# Patient Record
Sex: Male | Born: 1950 | Race: White | Hispanic: No | Marital: Married | Smoking: Current every day smoker
Health system: Southern US, Community
[De-identification: ages and names within clinical notes are randomized; demographics above are authoritative.]

## PROBLEM LIST (undated history)

## (undated) DIAGNOSIS — R0602 Shortness of breath: Secondary | ICD-10-CM

## (undated) DIAGNOSIS — F10229 Alcohol dependence with intoxication, unspecified: Secondary | ICD-10-CM

## (undated) DIAGNOSIS — Z8679 Personal history of other diseases of the circulatory system: Secondary | ICD-10-CM

## (undated) DIAGNOSIS — J42 Unspecified chronic bronchitis: Secondary | ICD-10-CM

## (undated) DIAGNOSIS — G43909 Migraine, unspecified, not intractable, without status migrainosus: Secondary | ICD-10-CM

## (undated) DIAGNOSIS — Z122 Encounter for screening for malignant neoplasm of respiratory organs: Secondary | ICD-10-CM

## (undated) DIAGNOSIS — I4891 Unspecified atrial fibrillation: Secondary | ICD-10-CM

## (undated) DIAGNOSIS — I10 Essential (primary) hypertension: Secondary | ICD-10-CM

## (undated) DIAGNOSIS — R519 Headache, unspecified: Secondary | ICD-10-CM

## (undated) DIAGNOSIS — F10982 Alcohol use, unspecified with alcohol-induced sleep disorder: Secondary | ICD-10-CM

## (undated) DIAGNOSIS — I480 Paroxysmal atrial fibrillation: Secondary | ICD-10-CM

## (undated) DIAGNOSIS — E119 Type 2 diabetes mellitus without complications: Secondary | ICD-10-CM

## (undated) DIAGNOSIS — N529 Male erectile dysfunction, unspecified: Secondary | ICD-10-CM

## (undated) DIAGNOSIS — K219 Gastro-esophageal reflux disease without esophagitis: Secondary | ICD-10-CM

## (undated) DIAGNOSIS — E782 Mixed hyperlipidemia: Secondary | ICD-10-CM

## (undated) DIAGNOSIS — F17200 Nicotine dependence, unspecified, uncomplicated: Secondary | ICD-10-CM

## (undated) DIAGNOSIS — R42 Dizziness and giddiness: Secondary | ICD-10-CM

## (undated) DIAGNOSIS — E1142 Type 2 diabetes mellitus with diabetic polyneuropathy: Secondary | ICD-10-CM

## (undated) DIAGNOSIS — J449 Chronic obstructive pulmonary disease, unspecified: Secondary | ICD-10-CM

## (undated) HISTORY — PX: COLONOSCOPY: SHX174

## (undated) HISTORY — PX: FLEXIBLE BRONCHOSCOPY W/ UPPER ENDOSCOPY: SHX1648

---

## 2018-08-09 ENCOUNTER — Other Ambulatory Visit: Payer: Self-pay | Admitting: Physician Assistant

## 2018-08-09 DIAGNOSIS — R1310 Dysphagia, unspecified: Secondary | ICD-10-CM

## 2018-08-12 ENCOUNTER — Encounter (INDEPENDENT_AMBULATORY_CARE_PROVIDER_SITE_OTHER): Payer: Self-pay | Admitting: Vascular Surgery

## 2018-08-12 ENCOUNTER — Other Ambulatory Visit (INDEPENDENT_AMBULATORY_CARE_PROVIDER_SITE_OTHER): Payer: Self-pay | Admitting: Vascular Surgery

## 2018-08-12 ENCOUNTER — Ambulatory Visit (INDEPENDENT_AMBULATORY_CARE_PROVIDER_SITE_OTHER): Payer: Medicare Other

## 2018-08-12 ENCOUNTER — Ambulatory Visit (INDEPENDENT_AMBULATORY_CARE_PROVIDER_SITE_OTHER): Payer: Medicare Other | Admitting: Vascular Surgery

## 2018-08-12 DIAGNOSIS — L97919 Non-pressure chronic ulcer of unspecified part of right lower leg with unspecified severity: Secondary | ICD-10-CM

## 2018-08-12 DIAGNOSIS — E782 Mixed hyperlipidemia: Secondary | ICD-10-CM | POA: Diagnosis not present

## 2018-08-12 DIAGNOSIS — J439 Emphysema, unspecified: Secondary | ICD-10-CM

## 2018-08-12 DIAGNOSIS — K219 Gastro-esophageal reflux disease without esophagitis: Secondary | ICD-10-CM | POA: Diagnosis not present

## 2018-08-13 ENCOUNTER — Ambulatory Visit
Admission: RE | Admit: 2018-08-13 | Discharge: 2018-08-13 | Disposition: A | Discharge status: Home / Self Care | Payer: Medicare Other | Source: Ambulatory Visit | Attending: Physician Assistant | Admitting: Physician Assistant

## 2018-08-13 DIAGNOSIS — R1310 Dysphagia, unspecified: Secondary | ICD-10-CM

## 2018-08-19 ENCOUNTER — Encounter (INDEPENDENT_AMBULATORY_CARE_PROVIDER_SITE_OTHER): Payer: Self-pay | Admitting: Vascular Surgery

## 2018-08-19 DIAGNOSIS — K219 Gastro-esophageal reflux disease without esophagitis: Secondary | ICD-10-CM | POA: Insufficient documentation

## 2018-08-19 DIAGNOSIS — E785 Hyperlipidemia, unspecified: Secondary | ICD-10-CM | POA: Insufficient documentation

## 2018-08-19 DIAGNOSIS — J449 Chronic obstructive pulmonary disease, unspecified: Secondary | ICD-10-CM | POA: Insufficient documentation

## 2018-08-19 DIAGNOSIS — L97909 Non-pressure chronic ulcer of unspecified part of unspecified lower leg with unspecified severity: Secondary | ICD-10-CM | POA: Insufficient documentation

## 2018-08-19 NOTE — Progress Notes (Signed)
MRN : [id number]  Richard Adams is a 68 y.o. (09-Nov-1950) male who presents with chief complaint of  Chief Complaint  Patient presents with  . Establish Care  .  History of Present Illness:   The patient is seen for evaluation of painful lower extremities and diminished pulses associated with ulceration of the foot.  The patient notes the ulcer has been present for multiple weeks and has not been improving.  It is very painful and has had some drainage.  No specific history of trauma noted by the patient.  The patient denies fever or chills.  the patient does have diabetes which has been difficult to control.  Patient notes prior to the ulcer developing his extremities were not painful with ambulation or activity  The patient denies rest pain or dangling of an extremity off the side of the bed during the night for relief. No prior interventions or surgeries.  No history of back problems or DJD of the lumbar sacral spine.   The patient denies amaurosis fugax or recent TIA symptoms. There are no recent neurological changes noted. The patient denies history of DVT, PE or superficial thrombophlebitis. The patient denies recent episodes of angina or shortness of breath.   ABI's are normal bilaterally  Current Meds  Medication Sig  . albuterol (PROVENTIL HFA;VENTOLIN HFA) 108 (90 Base) MCG/ACT inhaler Inhale into the lungs.  Marland Kitchen apixaban (ELIQUIS) 5 MG TABS tablet Take by mouth.  . dronedarone (MULTAQ) 400 MG tablet Take by mouth 2 (two) times daily.  Marland Kitchen EPINEPHrine 0.3 mg/0.3 mL IJ SOAJ injection as needed.  . famotidine (PEPCID) 20 MG tablet Take by mouth as needed.  . folic acid (FOLVITE) 1 MG tablet Take by mouth daily.  . Magnesium Amino Acid Chelate 20 % POWD daily.  . metFORMIN (GLUCOPHAGE) 500 MG tablet Take by mouth 2 (two) times daily.  . metoprolol succinate (TOPROL-XL) 25 MG 24 hr tablet Take 1 tablet by mouth daily.  . nicotine (NICOTROL) 10 MG inhaler Begin 4 weeks before  your Quit Smoking Day (10 cartridges per day)  Inhale multiple small puffs into mouth to control urges  . rosuvastatin (CRESTOR) 5 MG tablet 1 po three times weekly  . thiamine 100 MG tablet Take by mouth daily.  . traZODone (DESYREL) 100 MG tablet Take by mouth as needed.  . triamcinolone ointment (KENALOG) 0.1 % as needed.    No past medical history on file.    Social History Social History   Tobacco Use  . Smoking status: Current Every Day Smoker    Packs/day: 1.00    Types: Cigarettes  . Smokeless tobacco: Never Used  Substance Use Topics  . Alcohol use: Yes    Comment: 6-8 drink a day  . Drug use: Never    Family History No family history on file. No family history of bleeding/clotting disorders, porphyria or autoimmune disease   Allergies  Allergen Reactions  . Wasp Venom Protein Anaphylaxis     REVIEW OF SYSTEMS (Negative unless checked)  Constitutional: [] Weight loss  [] Fever  [] Chills Cardiac: [] Chest pain   [] Chest pressure   [] Palpitations   [] Shortness of breath when laying flat   [] Shortness of breath with exertion. Vascular:  [] Pain in legs with walking   [] Pain in legs at rest  [] History of DVT   [] Phlebitis   [x] Swelling in legs   [] Varicose veins   [x] Non-healing ulcers Pulmonary:   [] Uses home oxygen   [] Productive cough   [] Hemoptysis   []   Wheeze  [] COPD   [] Asthma Neurologic:  [] Dizziness   [] Seizures   [] History of stroke   [] History of TIA  [] Aphasia   [] Vissual changes   [] Weakness or numbness in arm   [] Weakness or numbness in leg Musculoskeletal:   [] Joint swelling   [] Joint pain   [] Low back pain Hematologic:  [] Easy bruising  [] Easy bleeding   [] Hypercoagulable state   [] Anemic Gastrointestinal:  [] Diarrhea   [] Vomiting  [] Gastroesophageal reflux/heartburn   [] Difficulty swallowing. Genitourinary:  [] Chronic kidney disease   [] Difficult urination  [] Frequent urination   [] Blood in urine Skin:  [] Rashes   [] Ulcers  Psychological:  [] History of  anxiety   []  History of major depression.  Physical Examination  Vitals:   08/12/18 1551  BP: 120/79  Pulse: 63  Resp: 16  Weight: 196 lb 6.4 oz (89.1 kg)  Height: 6\' 1"  (1.854 m)   Body mass index is 25.91 kg/m. Gen: WD/WN, NAD Head: [place]/AT, No temporalis wasting.  Ear/Nose/Throat: Hearing grossly intact, nares w/o erythema or drainage, poor dentition Eyes: PER, EOMI, sclera nonicteric.  Neck: Supple, no masses.  No bruit or JVD.  Pulmonary:  Good air movement, clear to auscultation bilaterally, no use of accessory muscles.  Cardiac: RRR, normal S1, S2, no Murmurs. Vascular: ulcers right foot noninfected Vessel Right Left  Radial Palpable Palpable  PT Palpable Palpable  DP Palpable Palpable  Gastrointestinal: soft, non-distended. No guarding/no peritoneal signs.  Musculoskeletal: M/S 5/5 throughout.  No deformity or atrophy.  Neurologic: CN 2-12 intact. Pain and light touch intact in extremities.  Symmetrical.  Speech is fluent. Motor exam as listed above. Psychiatric: Judgment intact, Mood & affect appropriate for pt's clinical situation. Dermatologic: No rashes + ulcers noted.  No changes consistent with cellulitis. Lymph : No Cervical lymphadenopathy, no lichenification or skin changes of chronic lymphedema.  CBC No results found for: WBC, HGB, HCT, MCV, PLT  BMET No results found for: NA, K, CL, CO2, GLUCOSE, BUN, CREATININE, CALCIUM, GFRNONAA, GFRAA CrCl cannot be calculated (No successful lab value found.).  COAG No results found for: INR, PROTIME  Radiology Dg Esophagus  Result Date: 08/13/2018 CLINICAL DATA:  Dysphagia EXAM: ESOPHOGRAM / BARIUM SWALLOW / BARIUM TABLET STUDY TECHNIQUE: Combined double contrast and single contrast examination performed using effervescent crystals, thick barium liquid, and thin barium liquid. The patient was observed with fluoroscopy swallowing a 13 mm barium sulphate tablet. FLUOROSCOPY TIME:  Fluoroscopy Time:  36 seconds  Radiation Exposure Index (if provided by the fluoroscopic device): 4 mGy Number of Acquired Spot Images: 0 COMPARISON:  None. FINDINGS: There was normal pharyngeal anatomy and motility. Contrast flowed freely through the esophagus without evidence of stricture or mass. There was normal esophageal mucosa without evidence of irregularity or ulceration. Esophageal motility was normal. Moderate gastroesophageal reflux. No definite hiatal hernia was demonstrated. At the end of the examination a 13 mm barium tablet was administered which transited through the esophagus and esophagogastric junction without delay. IMPRESSION: 1. Moderate severity gastroesophageal reflux. 2. Otherwise normal barium swallow. Electronically Signed   By: Kathreen Devoid   On: 08/13/2018 09:46   Vas [place] Abi With/wo Tbi  Result Date: 08/16/2018 LOWER EXTREMITY DOPPLER STUDY Indications: Rest pain.  Performing Technologist: Charlane Ferretti RT (R)(VS)  Examination Guidelines: A complete evaluation includes at minimum, Doppler waveform signals and systolic blood pressure reading at the level of bilateral brachial, anterior tibial, and posterior tibial arteries, when vessel segments are accessible. Bilateral testing is considered an integral part of a  complete examination. Photoelectric Plethysmograph (PPG) waveforms and toe systolic pressure readings are included as required and additional duplex testing as needed. Limited examinations for reoccurring indications may be performed as noted.  ABI Findings: +---------+------------------+-----+---------+--------+ Right    Rt Pressure (mmHg)IndexWaveform Comment  +---------+------------------+-----+---------+--------+ Brachial 122                                      +---------+------------------+-----+---------+--------+ ATA      161               1.32 triphasic         +---------+------------------+-----+---------+--------+ PTA      158               1.30 triphasic          +---------+------------------+-----+---------+--------+ Great Toe142               1.16 Normal            +---------+------------------+-----+---------+--------+ +---------+------------------+-----+---------+-------+ Left     Lt Pressure (mmHg)IndexWaveform Comment +---------+------------------+-----+---------+-------+ Brachial 113                                     +---------+------------------+-----+---------+-------+ ATA      149               1.22 triphasic        +---------+------------------+-----+---------+-------+ PTA      169               1.39 triphasic        +---------+------------------+-----+---------+-------+ Georgeanna Harrison               1.13 Normal           +---------+------------------+-----+---------+-------+ +-------+-----------+-----------+------------+------------+ ABI/TBIToday's ABIToday's TBIPrevious ABIPrevious TBI +-------+-----------+-----------+------------+------------+ Right  1.32       1.16                                +-------+-----------+-----------+------------+------------+ Left   1.39       1.13                                +-------+-----------+-----------+------------+------------+ Bilateral ABI pressures are elevated suggestive of medial wall calcification. Summary: Right: Resting right ankle-brachial index indicates noncompressible right lower extremity arteries.The right toe-brachial index is normal. ABIs are unreliable. Left: Resting left ankle-brachial index indicates noncompressible left lower extremity arteries.The left toe-brachial index is normal. ABIs are unreliable.  *See table(s) above for measurements and observations.  Electronically signed by Hortencia Pilar MD on 08/16/2018 at 4:42:53 PM.   Final      Assessment/Plan 1. Ulcer of right lower extremity, unspecified ulcer stage ([place])  Recommend:  Continue wound care as ordered by wound service  I do not find evidence of life style limiting vascular  disease. The patient specifically denies life style limitation.  Previous noninvasive studies including ABI's of the legs do not identify critical vascular problems.  The patient should continue walking and begin a more formal exercise program. The patient should continue his antiplatelet therapy and aggressive treatment of the lipid abnormalities.  The patient should begin wearing graduated compression socks 15-20 mmHg strength to control her mild edema.  Patient will follow-up with me on a PRN  basis   2. Mixed hyperlipidemia Continue statin as ordered and reviewed, no changes at this time   3. Pulmonary emphysema, unspecified emphysema type ([place]) Continue pulmonary medications and aerosols as already ordered, these medications have been reviewed and there are no changes at this time.    4. Gastroesophageal reflux disease without esophagitis Continue PPI as already ordered, this medication has been reviewed and there are no changes at this time.  Avoidence of caffeine and alcohol  Moderate elevation of the head of the bed     Hortencia Pilar, MD  08/19/2018 6:21 PM

## 2019-02-08 IMAGING — RF DG ESOPHAGUS
11 of 16 series · 14 of 24 positions shown · non-contrast
Comparison: None.

CLINICAL DATA: Dysphagia

EXAM:
ESOPHOGRAM / BARIUM SWALLOW / BARIUM TABLET STUDY
TECHNIQUE: Combined double contrast and single contrast examination performed
using effervescent crystals, thick barium liquid, and thin barium
liquid. The patient was observed with fluoroscopy swallowing a 13 mm
barium sulphate tablet.
FLUOROSCOPY TIME:  Fluoroscopy Time:  36 seconds
Radiation Exposure Index (if provided by the fluoroscopic device): 4
mGy
Number of Acquired Spot Images: 0

[Series 1: cp_standard · 0.25mm/px · 2 of 27 frames shown (1 of 11)]
[frame 3/27]
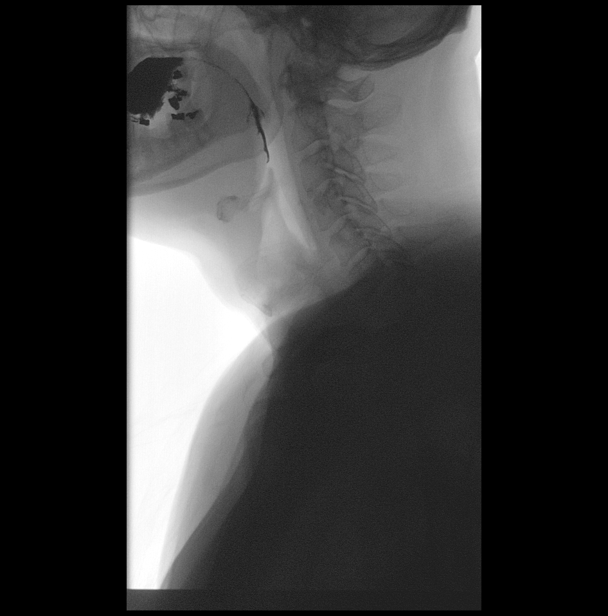
[frame 14/27]
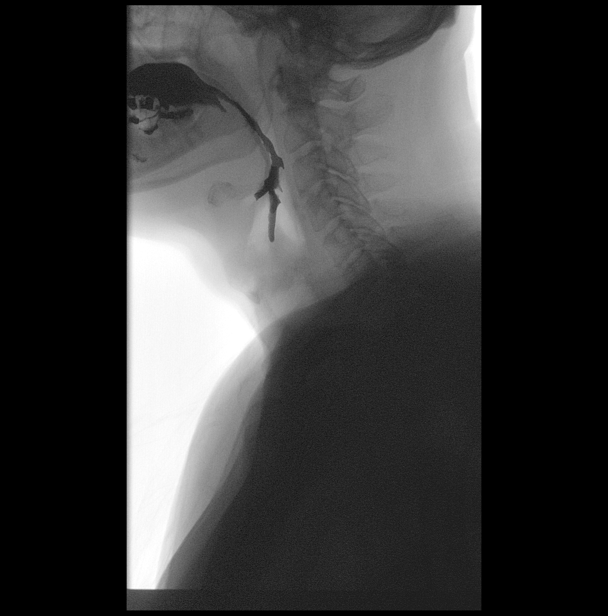

[Series 2: cp_standard · 0.25mm/px · 2 of 43 frames shown (2 of 11)]
[frame 7/43]
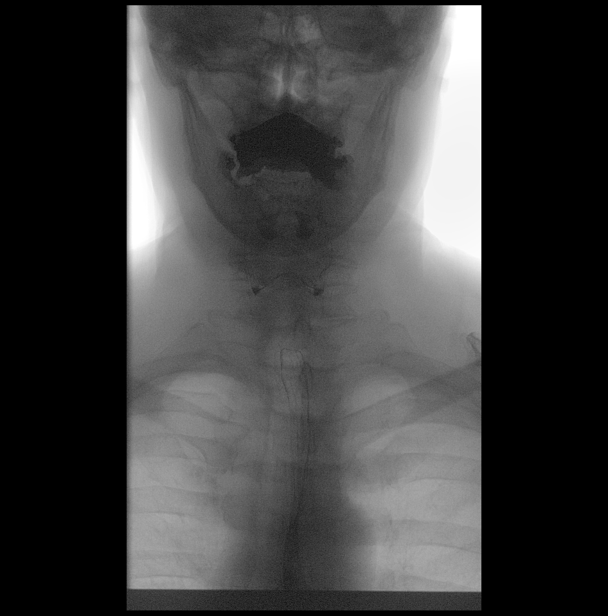
[frame 37/43]
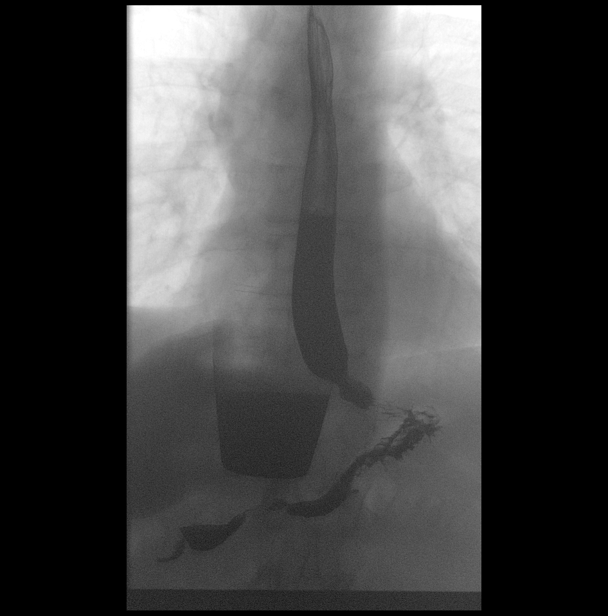

[Series 3: cp_standard · 0.25mm/px · 2 of 64 frames shown (3 of 11)]
[frame 4/64]
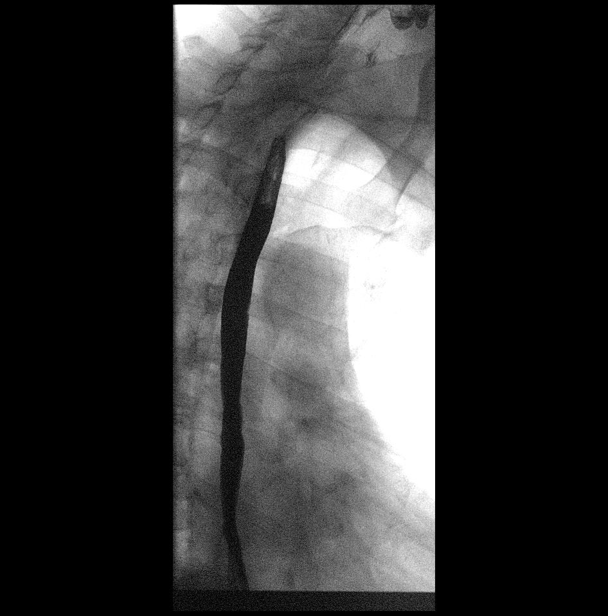
[frame 33/64]
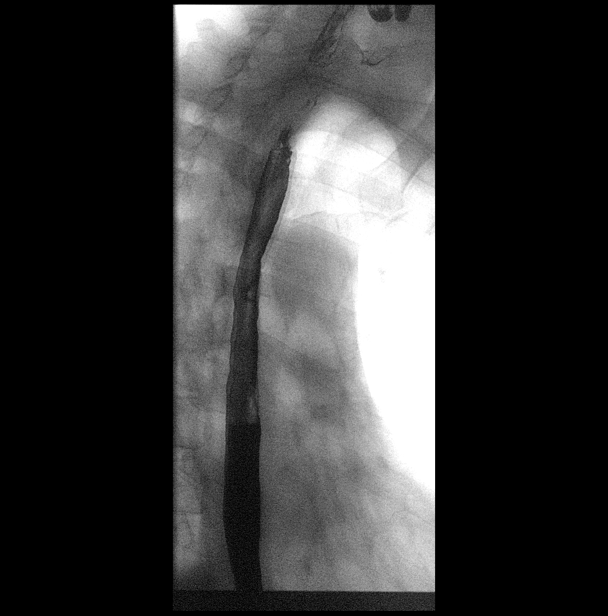

[Series 4: cp_standard · 0.25mm/px · 1 of 1 slices shown (4 of 11)]
[im 1/1]
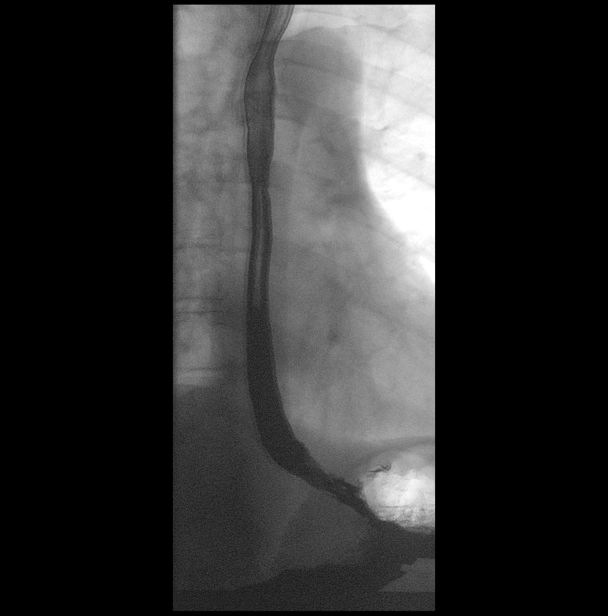

[Series 5: cp_standard · 0.25mm/px · 1 of 1 slices shown (5 of 11)]
[im 1/1]
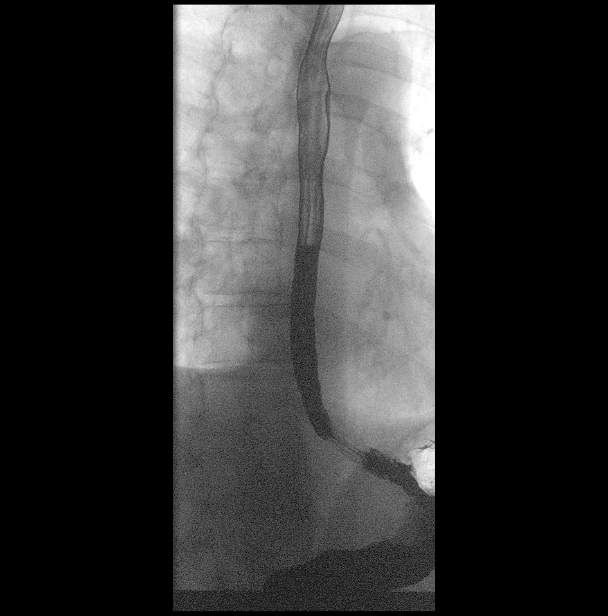

[Series 7: cp_standard · 0.25mm/px · 1 of 1 slices shown (6 of 11)]
[im 1/1]
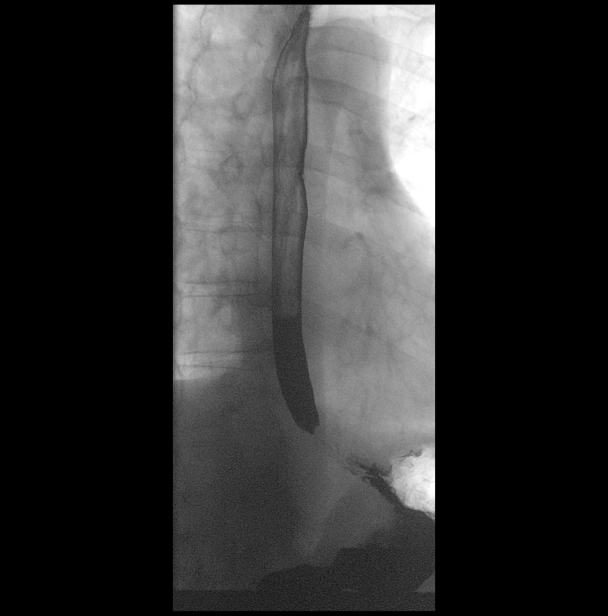

[Series 9: cp_standard · 0.25mm/px · 1 of 1 slices shown (7 of 11)]
[im 1/1]
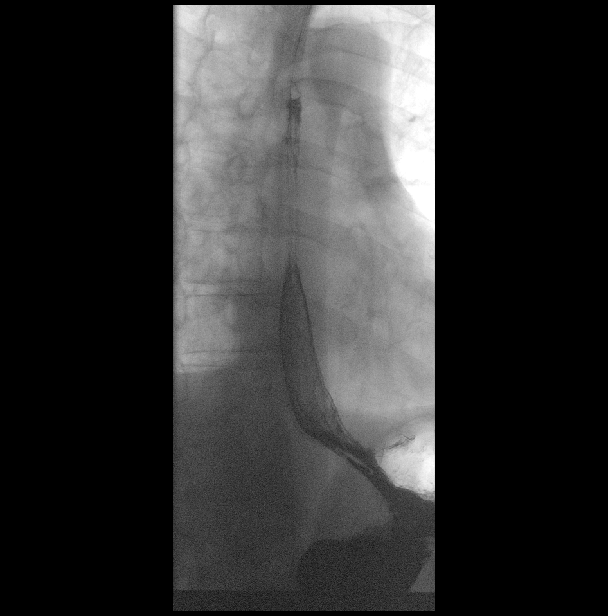

[Series 11: cp_standard · 0.28mm/px · 1 of 1 slices shown (8 of 11)]
[im 1/1]
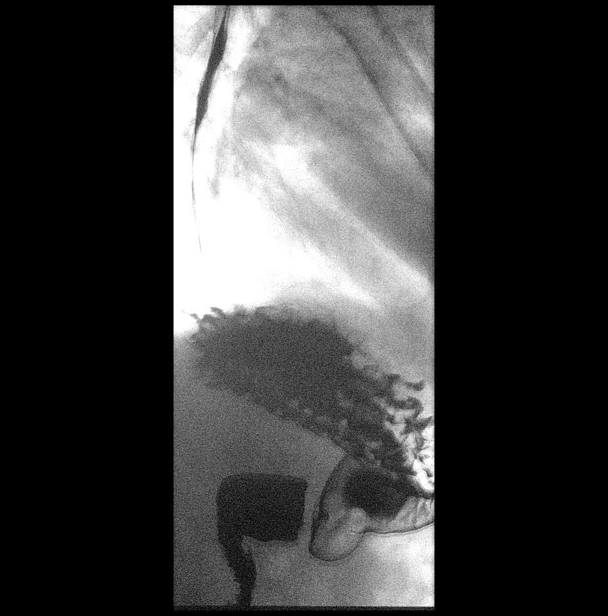

[Series 12: cp_standard · 0.27mm/px · 1 of 1 slices shown (9 of 11)]
[im 1/1]
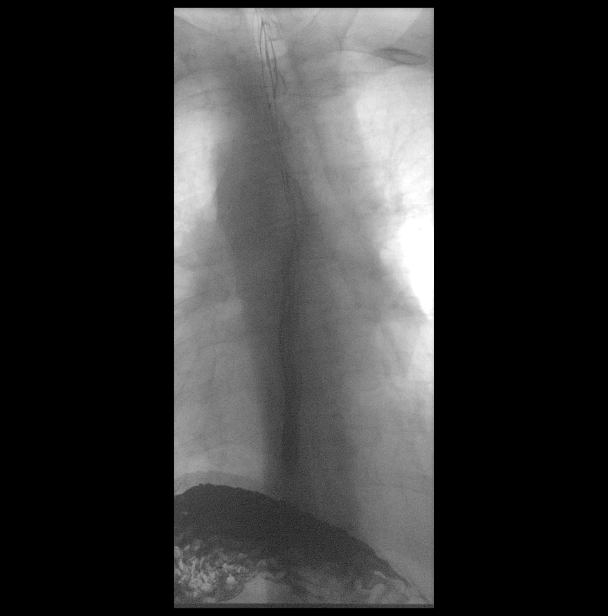

[Series 14: cp_standard · 0.28mm/px · 1 of 1 slices shown (10 of 11)]
[im 1/1]
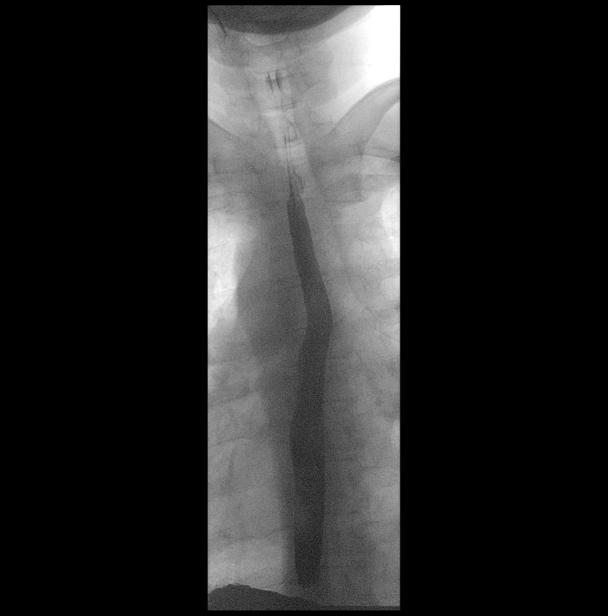

[Series 16: cp_standard · 0.28mm/px · 1 of 1 slices shown (11 of 11)]
[im 1/1]
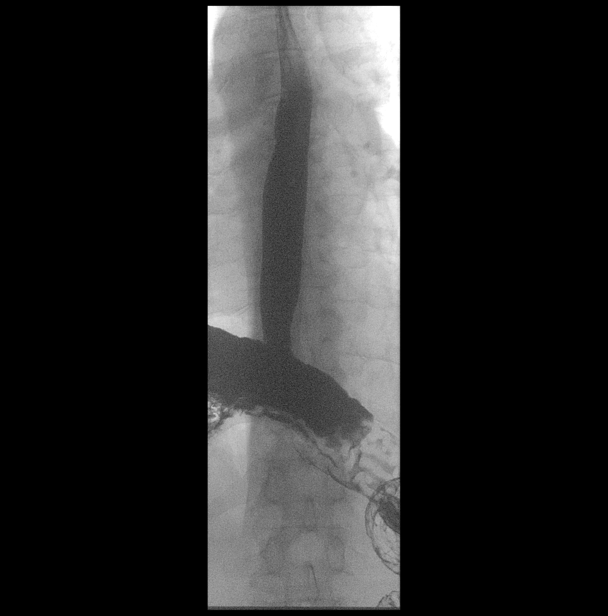

[14 of 24 positions shown; findings below may reference images not displayed]

FINDINGS: There was normal pharyngeal anatomy and motility. Contrast flowed
freely through the esophagus without evidence of stricture or mass.
There was normal esophageal mucosa without evidence of irregularity
or ulceration. Esophageal motility was normal. Moderate
gastroesophageal reflux. No definite hiatal hernia was demonstrated.

At the end of the examination a 13 mm barium tablet was administered
which transited through the esophagus and esophagogastric junction
without delay.
IMPRESSION: 1. Moderate severity gastroesophageal reflux.
2. Otherwise normal barium swallow.

## 2019-12-12 ENCOUNTER — Other Ambulatory Visit: Payer: Self-pay

## 2019-12-12 ENCOUNTER — Other Ambulatory Visit
Admission: RE | Admit: 2019-12-12 | Discharge: 2019-12-12 | Disposition: A | Discharge status: Home / Self Care | Payer: Medicare Other | Source: Ambulatory Visit | Attending: Internal Medicine | Admitting: Internal Medicine

## 2019-12-12 DIAGNOSIS — Z01812 Encounter for preprocedural laboratory examination: Secondary | ICD-10-CM | POA: Diagnosis present

## 2019-12-12 DIAGNOSIS — Z20822 Contact with and (suspected) exposure to covid-19: Secondary | ICD-10-CM | POA: Diagnosis not present

## 2019-12-13 ENCOUNTER — Encounter: Payer: Self-pay | Admitting: Internal Medicine

## 2019-12-13 LAB — SARS CORONAVIRUS 2 (TAT 6-24 HRS): SARS Coronavirus 2: NEGATIVE

## 2019-12-14 ENCOUNTER — Ambulatory Visit: Payer: Medicare Other | Admitting: Anesthesiology

## 2019-12-14 ENCOUNTER — Ambulatory Visit
Admission: RE | Admit: 2019-12-14 | Discharge: 2019-12-14 | Disposition: A | Discharge status: Home / Self Care | Payer: Medicare Other | Attending: Internal Medicine | Admitting: Internal Medicine

## 2019-12-14 ENCOUNTER — Other Ambulatory Visit: Payer: Self-pay

## 2019-12-14 ENCOUNTER — Encounter: Payer: Self-pay | Admitting: Internal Medicine

## 2019-12-14 ENCOUNTER — Encounter
Admission: RE | Disposition: A | Discharge status: Home / Self Care | Payer: Self-pay | Source: Home / Self Care | Attending: Internal Medicine

## 2019-12-14 DIAGNOSIS — R634 Abnormal weight loss: Secondary | ICD-10-CM | POA: Diagnosis not present

## 2019-12-14 DIAGNOSIS — Z7901 Long term (current) use of anticoagulants: Secondary | ICD-10-CM | POA: Diagnosis not present

## 2019-12-14 DIAGNOSIS — J449 Chronic obstructive pulmonary disease, unspecified: Secondary | ICD-10-CM | POA: Diagnosis not present

## 2019-12-14 DIAGNOSIS — R933 Abnormal findings on diagnostic imaging of other parts of digestive tract: Secondary | ICD-10-CM | POA: Diagnosis not present

## 2019-12-14 DIAGNOSIS — I480 Paroxysmal atrial fibrillation: Secondary | ICD-10-CM | POA: Insufficient documentation

## 2019-12-14 DIAGNOSIS — C154 Malignant neoplasm of middle third of esophagus: Secondary | ICD-10-CM | POA: Insufficient documentation

## 2019-12-14 DIAGNOSIS — Z79899 Other long term (current) drug therapy: Secondary | ICD-10-CM | POA: Insufficient documentation

## 2019-12-14 DIAGNOSIS — E1142 Type 2 diabetes mellitus with diabetic polyneuropathy: Secondary | ICD-10-CM | POA: Insufficient documentation

## 2019-12-14 DIAGNOSIS — R1314 Dysphagia, pharyngoesophageal phase: Secondary | ICD-10-CM | POA: Diagnosis not present

## 2019-12-14 DIAGNOSIS — E782 Mixed hyperlipidemia: Secondary | ICD-10-CM | POA: Insufficient documentation

## 2019-12-14 DIAGNOSIS — K219 Gastro-esophageal reflux disease without esophagitis: Secondary | ICD-10-CM | POA: Insufficient documentation

## 2019-12-14 HISTORY — DX: Gastro-esophageal reflux disease without esophagitis: K21.9

## 2019-12-14 HISTORY — DX: Unspecified atrial fibrillation: I48.91

## 2019-12-14 HISTORY — DX: Unspecified chronic bronchitis: J42

## 2019-12-14 HISTORY — DX: Personal history of other diseases of the circulatory system: Z86.79

## 2019-12-14 HISTORY — DX: Nicotine dependence, unspecified, uncomplicated: F17.200

## 2019-12-14 HISTORY — DX: Mixed hyperlipidemia: E78.2

## 2019-12-14 HISTORY — DX: Type 2 diabetes mellitus without complications: E11.9

## 2019-12-14 HISTORY — DX: Encounter for screening for malignant neoplasm of respiratory organs: Z12.2

## 2019-12-14 HISTORY — DX: Type 2 diabetes mellitus with diabetic polyneuropathy: E11.42

## 2019-12-14 HISTORY — PX: ESOPHAGOGASTRODUODENOSCOPY (EGD) WITH PROPOFOL: SHX5813

## 2019-12-14 HISTORY — DX: Alcohol use, unspecified with alcohol-induced sleep disorder: F10.982

## 2019-12-14 HISTORY — DX: Migraine, unspecified, not intractable, without status migrainosus: G43.909

## 2019-12-14 HISTORY — DX: Headache, unspecified: R51.9

## 2019-12-14 HISTORY — DX: Male erectile dysfunction, unspecified: N52.9

## 2019-12-14 HISTORY — DX: Chronic obstructive pulmonary disease, unspecified: J44.9

## 2019-12-14 HISTORY — DX: Dizziness and giddiness: R42

## 2019-12-14 HISTORY — DX: Essential (primary) hypertension: I10

## 2019-12-14 HISTORY — DX: Shortness of breath: R06.02

## 2019-12-14 HISTORY — DX: Paroxysmal atrial fibrillation: I48.0

## 2019-12-14 HISTORY — DX: Alcohol dependence with intoxication, unspecified: F10.229

## 2019-12-14 LAB — GLUCOSE, CAPILLARY: Glucose-Capillary: 121 mg/dL — ABNORMAL HIGH (ref 70–99)

## 2019-12-14 SURGERY — ESOPHAGOGASTRODUODENOSCOPY (EGD) WITH PROPOFOL
Anesthesia: General

## 2019-12-14 MED ORDER — SODIUM CHLORIDE 0.9 % IV SOLN: INTRAVENOUS | Status: DC

## 2019-12-14 MED ORDER — PROPOFOL 10 MG/ML IV BOLUS
INTRAVENOUS | Status: DC | PRN
  Administered 2019-12-14: 60 mg via INTRAVENOUS

## 2019-12-14 MED ORDER — PROPOFOL 500 MG/50ML IV EMUL
INTRAVENOUS | Status: AC
  Filled 2019-12-14: qty 50

## 2019-12-14 MED ORDER — PROPOFOL 500 MG/50ML IV EMUL
INTRAVENOUS | Status: DC | PRN
  Administered 2019-12-14: 150 ug/kg/min via INTRAVENOUS

## 2019-12-14 NOTE — Interval H&P Note (Signed)
History and Physical Interval Note:  12/14/2019 9:16 AM  Richard Adams  has presented today for surgery, with the diagnosis of DYSPHAGIA.  The various methods of treatment have been discussed with the patient and family. After consideration of risks, benefits and other options for treatment, the patient has consented to  Procedure(s): ESOPHAGOGASTRODUODENOSCOPY (EGD) WITH PROPOFOL (N/A) as a surgical intervention.  The patient's history has been reviewed, patient examined, no change in status, stable for surgery.  I have reviewed the patient's chart and labs.  Questions were answered to the patient's satisfaction.     [place], [place]

## 2019-12-14 NOTE — Transfer of Care (Signed)
Immediate Anesthesia Transfer of Care Note  Patient: Richard Adams  Procedure(s) Performed: ESOPHAGOGASTRODUODENOSCOPY (EGD) WITH PROPOFOL (N/A )  Patient Location: PACU  Anesthesia Type:General  Level of Consciousness: awake and alert   Airway & Oxygen Therapy: Patient Spontanous Breathing and Patient connected to nasal cannula oxygen  Post-op Assessment: Report given to RN and Post -op Vital signs reviewed and stable  Post vital signs: Reviewed and stable  Last Vitals:  Vitals Value Taken Time  BP 85/51 12/14/19 0938  Temp 36.3 C 12/14/19 0938  Pulse 65 12/14/19 0939  Resp 19 12/14/19 0939  SpO2 97 % 12/14/19 0939  Vitals shown include unvalidated device data.  Last Pain:  Vitals:   12/14/19 0938  TempSrc: Temporal  PainSc: 0-No pain         Complications: No apparent anesthesia complications

## 2019-12-14 NOTE — Op Note (Signed)
[facility] Patient Name: Richard Adams Procedure Date: 12/14/2019 9:16 AM MRN: [id number] Account #: [id number] Date of Birth: [birth year] Admit Type: Outpatient Age: 69 Room: [facility] ENDO ROOM 3 Gender: Male Note Status: Finalized Procedure:             Upper GI endoscopy Indications:           Esophageal dysphagia, Abnormal CT of the GI tract,                         Weight loss Providers:             Benay Pike. Ariv Penrod MD, MD Medicines:             Propofol per Anesthesia Complications:         No immediate complications. Estimated blood loss:                         Minimal. Procedure:             Pre-Anesthesia Assessment:                        - The risks and benefits of the procedure and the                         sedation options and risks were discussed with the                         patient. All questions were answered and informed                         consent was obtained.                        - The risks and benefits of the procedure and the                         sedation options and risks were discussed with the                         patient. All questions were answered and informed                         consent was obtained.                        - Patient identification and proposed procedure were                         verified prior to the procedure by the nurse. The                         procedure was verified in the procedure room.                        - ASA Grade Assessment: III - A patient with severe                         systemic disease.                        -  After reviewing the risks and benefits, the patient                         was deemed in satisfactory condition to undergo the                         procedure.                        After obtaining informed consent, the endoscope was                         passed under direct vision. Throughout the procedure,                         the  patient's blood pressure, pulse, and oxygen                         saturations were monitored continuously. The Endoscope                         was introduced through the mouth, and advanced to the                         middle third of esophagus. The upper GI endoscopy was                         technically difficult and complex due to a partially                         obstructing mass. The patient tolerated the procedure                         well. Findings:      A large, fungating mass with no bleeding and no stigmata of recent       bleeding was found in the middle third of the esophagus, 28 cm from the       incisors. The mass was partially obstructing and circumferential.       Biopsies were taken with a cold forceps for histology.      Scope was unable to traverse the very narrow lumen to complete the       examination. The procedure was terminated at this point. The pathology       specimen was placed into Bottle Number 1. Estimated blood loss was       minimal. 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                         Biopsied. Recommendation:        - Patient has a contact number available for                         emergencies. The signs and symptoms of potential  delayed complications were discussed with the patient.                         Return to normal activities tomorrow. Written                         discharge instructions were provided to the patient.                        - Clear liquid diet.                        - Resume Eliquis (apixaban) at prior dose today. Refer                         to managing physician for further adjustment of                         therapy.                        - Refer to an oncologist at appointment to be                         scheduled.                        - The findings and  recommendations were discussed with                         the patient.                        - Return to GI office PRN.                        - The findings and recommendations were discussed with                         the patient. Procedure Code(s):     --- Professional ---                        [phone], Esophagoscopy, flexible, transoral; with                         biopsy, single or multiple Diagnosis Code(s):     --- Professional ---                        R93.3, Abnormal findings on diagnostic imaging of                         other parts of digestive tract                        R63.4, Abnormal weight loss                        R13.14, Dysphagia, pharyngoesophageal phase                        C15.4, Malignant neoplasm of middle third  of esophagus CPT copyright 2019 [employer]. All rights reserved. The codes documented in this report are preliminary and upon coder review may  be revised to meet current compliance requirements. Efrain Sella MD, MD 12/14/2019 9:39:27 AM This report has been signed electronically. Number of Addenda: 0 Note Initiated On: 12/14/2019 9:16 AM Estimated Blood Loss:  Estimated blood loss was minimal.      [facility]

## 2019-12-14 NOTE — Anesthesia Preprocedure Evaluation (Signed)
Anesthesia Evaluation  Patient identified by MRN, date of birth, ID band Patient awake    Reviewed: Allergy & Precautions, H&P , NPO status , Patient's Chart, lab work & pertinent test results, reviewed documented beta blocker date and time   History of Anesthesia Complications Negative for: history of anesthetic complications  Airway Mallampati: II  TM Distance: >3 FB Neck ROM: full    Dental no notable dental hx. (+) Dental Advidsory Given, Missing, Poor Dentition, Chipped   Pulmonary shortness of breath and with exertion, neg sleep apnea, COPD,  COPD inhaler, neg recent URI, Current Smoker and Patient abstained from smoking.,    Pulmonary exam normal        Cardiovascular Exercise Tolerance: Good hypertension, (-) angina(-) Past MI and (-) Cardiac Stents + dysrhythmias Atrial Fibrillation (-) Valvular Problems/Murmurs Rhythm:regular Rate:Normal     Neuro/Psych  Headaches, Seizures -, Well Controlled,  negative psych ROS   GI/Hepatic Neg liver ROS, GERD  ,  Endo/Other  diabetes  Renal/GU negative Renal ROS  negative genitourinary   Musculoskeletal   Abdominal   Peds  Hematology negative hematology ROS (+)   Anesthesia Other Findings Past Medical History: No date: Alcohol dependence with intoxication (HCC) No date: Alcohol induced insomnia (HCC) No date: Atrial fibrillation with RVR (HCC) No date: Chronic bronchitis (HCC) No date: COPD (chronic obstructive pulmonary disease) (HCC) No date: Diabetes mellitus without complication (HCC) No date: ED (erectile dysfunction) No date: GERD (gastroesophageal reflux disease) No date: Headache No date: History of subdural hematoma No date: Hypertension No date: Migraine No date: Mixed hyperlipidemia No date: PAF (paroxysmal atrial fibrillation) (HCC) No date: Screening for malignant neoplasm of respiratory organ No date: SOB (shortness of breath) No date: Tobacco use  disorder No date: Type 2 diabetes mellitus with peripheral neuropathy (HCC) No date: Vertigo   Reproductive/Obstetrics negative OB ROS                             Anesthesia Physical Anesthesia Plan  ASA: III  Anesthesia Plan: General   Post-op Pain Management:    Induction: Intravenous  PONV Risk Score and Plan: 1 and Propofol infusion and TIVA  Airway Management Planned: Natural Airway and Nasal Cannula  Additional Equipment:   Intra-op Plan:   Post-operative Plan:   Informed Consent: I have reviewed the patients History and Physical, chart, labs and discussed the procedure including the risks, benefits and alternatives for the proposed anesthesia with the patient or authorized representative who has indicated his/her understanding and acceptance.     Dental Advisory Given  Plan Discussed with: Anesthesiologist, CRNA and Surgeon  Anesthesia Plan Comments:         Anesthesia Quick Evaluation

## 2019-12-14 NOTE — Anesthesia Postprocedure Evaluation (Signed)
Anesthesia Post Note  Patient: Richard Adams  Procedure(s) Performed: ESOPHAGOGASTRODUODENOSCOPY (EGD) WITH PROPOFOL (N/A )  Patient location during evaluation: Endoscopy Anesthesia Type: General Level of consciousness: awake and alert Pain management: pain level controlled Vital Signs Assessment: post-procedure vital signs reviewed and stable Respiratory status: spontaneous breathing, nonlabored ventilation, respiratory function stable and patient connected to nasal cannula oxygen Cardiovascular status: blood pressure returned to baseline and stable Postop Assessment: no apparent nausea or vomiting Anesthetic complications: no     Last Vitals:  Vitals:   12/14/19 1038 12/14/19 1048  BP: (!) 145/97 (!) 158/100  Pulse: 70 71  Resp: 13 13  Temp:    SpO2: 100% 100%    Last Pain:  Vitals:   12/14/19 1048  TempSrc:   PainSc: 0-No pain                 Martha Clan

## 2019-12-14 NOTE — H&P (Signed)
Outpatient short stay form Pre-procedure 12/14/2019 9:14 AM Richard Adams K. Alice Reichert, M.D.  Primary Physician: Hortencia Pilar, MD  Reason for visit: Dysphagia, abnormal CT scan  History of present illness: Patient is a 69 year old tobacco smoker with a history of dysphagia for the past 3 months.  He is unable to eat solid and is unable to swallow thick liquids.  Recent CT scan of the chest revealed moderate to severe thickening in the mid to distal esophagus.  Barium swallow performed and 2019 revealed moderate reflux but was deemed otherwise negative.  Patient has lost about 9 pounds in the last 3 months.  He denies any GERD symptoms or frequent NSAID use.  He has history of Eliquis use.    Current Facility-Administered Medications:  .  0.9 %  sodium chloride infusion, , Intravenous, Continuous, [place], Benay Pike, MD, Last Rate: 20 mL/hr at 12/14/19 0849, New Bag at 12/14/19 0849  Medications Prior to Admission  Medication Sig Dispense Refill Last Dose  . apixaban (ELIQUIS) 5 MG TABS tablet Take by mouth.   12/11/2019 at Unknown time  . flecainide (TAMBOCOR) 100 MG tablet Take 100 mg by mouth 2 (two) times daily.     . folic acid (FOLVITE) 1 MG tablet Take 1 mg by mouth daily.     . pantoprazole (PROTONIX) 40 MG tablet Take 40 mg by mouth daily.     Marland Kitchen umeclidinium-vilanterol (ANORO ELLIPTA) 62.5-25 MCG/INH AEPB Inhale 1 puff into the lungs daily.     Marland Kitchen albuterol (PROVENTIL HFA;VENTOLIN HFA) 108 (90 Base) MCG/ACT inhaler Inhale into the lungs.     . dronedarone (MULTAQ) 400 MG tablet Take by mouth 2 (two) times daily.     Marland Kitchen EPINEPHrine 0.3 mg/0.3 mL IJ SOAJ injection as needed.     . famotidine (PEPCID) 20 MG tablet Take by mouth as needed.     . Magnesium Amino Acid Chelate 20 % POWD daily.     . metFORMIN (GLUCOPHAGE) 500 MG tablet Take by mouth 2 (two) times daily.     . metoprolol succinate (TOPROL-XL) 25 MG 24 hr tablet Take 1 tablet by mouth daily.     . nicotine (NICOTROL) 10 MG inhaler  Begin 4 weeks before your Quit Smoking Day (10 cartridges per day)  Inhale multiple small puffs into mouth to control urges     . rosuvastatin (CRESTOR) 5 MG tablet 1 po three times weekly     . tiotropium (SPIRIVA HANDIHALER) 18 MCG inhalation capsule as needed.     . traZODone (DESYREL) 100 MG tablet Take by mouth as needed.     . triamcinolone ointment (KENALOG) 0.1 % as needed.        Allergies  Allergen Reactions  . Wasp Venom Protein Anaphylaxis  . Chantix [Varenicline]   . Naltrexone      Past Medical History:  Diagnosis Date  . Alcohol dependence with intoxication ([place])   . Alcohol induced insomnia ([place])   . Atrial fibrillation with RVR ([place])   . Chronic bronchitis ([place])   . COPD (chronic obstructive pulmonary disease) ([place])   . Diabetes mellitus without complication ([place])   . ED (erectile dysfunction)   . GERD (gastroesophageal reflux disease)   . Headache   . History of subdural hematoma   . Hypertension   . Migraine   . Mixed hyperlipidemia   . PAF (paroxysmal atrial fibrillation) ([place])   . Screening for malignant neoplasm of respiratory organ   . SOB (shortness of breath)   .  Tobacco use disorder   . Type 2 diabetes mellitus with peripheral neuropathy (H[facility])   . Vertigo     Review of systems:  Otherwise negative.    Physical Exam  Gen: Alert, oriented. Appears stated age.  HEENT: [place]/AT. PERRLA. Lungs: CTA, no wheezes. CV: RR nl S1, S2. Abd: soft, benign, no masses. BS+ Ext: No edema. Pulses 2+    Planned procedures: Proceed with EGD. The patient understands the nature of the planned procedure, indications, risks, alternatives and potential complications including but not limited to bleeding, infection, perforation, damage to internal organs and possible oversedation/side effects from anesthesia. The patient agrees and gives consent to proceed.  Please refer to procedure notes for findings, recommendations and patient disposition/instructions.      Charmin Aguiniga K. Alice Reichert, M.D. Gastroenterology 12/14/2019  9:14 AM

## 2019-12-15 ENCOUNTER — Encounter: Payer: Self-pay | Admitting: *Deleted

## 2019-12-15 ENCOUNTER — Other Ambulatory Visit: Payer: Self-pay | Admitting: Internal Medicine

## 2019-12-15 LAB — SURGICAL PATHOLOGY

## 2019-12-16 ENCOUNTER — Telehealth: Payer: Self-pay

## 2019-12-16 NOTE — Telephone Encounter (Signed)
Navigation review. Pending scheduling with insurance approval per spouse.

## 2019-12-22 ENCOUNTER — Inpatient Hospital Stay: Payer: Medicare Other | Attending: Cytopathology

## 2019-12-22 NOTE — Progress Notes (Signed)
Tumor Board Documentation  Richard Adams was presented by Richard Au, RN at our Tumor Board on 12/22/2019, which included representatives from medical oncology, radiation oncology, surgical oncology, surgical, radiology, pathology, navigation, pharmacy, internal medicine, research, palliative care.  Richard Adams currently presents as an external consult, for new positive pathology with history of the following treatments: surgical intervention(s).  Additionally, we reviewed previous medical and familial history, history of present illness, and recent lab results along with all available histopathologic and imaging studies. The tumor board considered available treatment options and made the following recommendations:   Patient went to [facility] for care  The following procedures/referrals were also placed: No orders of the defined types were placed in this encounter.   Clinical Trial Status: not discussed   Staging used:    National site-specific guidelines   were discussed with respect to the case.  Tumor board is a meeting of clinicians from various specialty areas who evaluate and discuss patients for whom a multidisciplinary approach is being considered. Final determinations in the plan of care are those of the provider(s). The responsibility for follow up of recommendations given during tumor board is that of the provider.   Today's extended care, comprehensive team conference, Richard Adams was not present for the discussion and was not examined.   Multidisciplinary Tumor Board is a multidisciplinary case peer review process.  Decisions discussed in the Multidisciplinary Tumor Board reflect the opinions of the specialists present at the conference without having examined the patient.  Ultimately, treatment and diagnostic decisions rest with the primary provider(s) and the patient.

## 2022-03-22 ENCOUNTER — Inpatient Hospital Stay
Admission: EM | Admit: 2022-03-22 | Discharge: 2022-03-26 | DRG: 871 | Disposition: A | Discharge status: Home / Self Care | Payer: Medicare Other | Attending: Internal Medicine | Admitting: Internal Medicine

## 2022-03-22 ENCOUNTER — Other Ambulatory Visit: Payer: Self-pay

## 2022-03-22 ENCOUNTER — Emergency Department: Payer: Medicare Other

## 2022-03-22 DIAGNOSIS — E782 Mixed hyperlipidemia: Secondary | ICD-10-CM | POA: Diagnosis present

## 2022-03-22 DIAGNOSIS — E1142 Type 2 diabetes mellitus with diabetic polyneuropathy: Secondary | ICD-10-CM | POA: Diagnosis present

## 2022-03-22 DIAGNOSIS — J9601 Acute respiratory failure with hypoxia: Secondary | ICD-10-CM | POA: Diagnosis not present

## 2022-03-22 DIAGNOSIS — J441 Chronic obstructive pulmonary disease with (acute) exacerbation: Secondary | ICD-10-CM | POA: Diagnosis present

## 2022-03-22 DIAGNOSIS — C159 Malignant neoplasm of esophagus, unspecified: Secondary | ICD-10-CM | POA: Diagnosis present

## 2022-03-22 DIAGNOSIS — E43 Unspecified severe protein-calorie malnutrition: Secondary | ICD-10-CM | POA: Diagnosis present

## 2022-03-22 DIAGNOSIS — J449 Chronic obstructive pulmonary disease, unspecified: Secondary | ICD-10-CM | POA: Diagnosis present

## 2022-03-22 DIAGNOSIS — I10 Essential (primary) hypertension: Secondary | ICD-10-CM | POA: Diagnosis present

## 2022-03-22 DIAGNOSIS — A419 Sepsis, unspecified organism: Principal | ICD-10-CM | POA: Diagnosis present

## 2022-03-22 DIAGNOSIS — M549 Dorsalgia, unspecified: Secondary | ICD-10-CM | POA: Diagnosis present

## 2022-03-22 DIAGNOSIS — Z20822 Contact with and (suspected) exposure to covid-19: Secondary | ICD-10-CM | POA: Diagnosis present

## 2022-03-22 DIAGNOSIS — L89322 Pressure ulcer of left buttock, stage 2: Secondary | ICD-10-CM | POA: Diagnosis present

## 2022-03-22 DIAGNOSIS — Z681 Body mass index (BMI) 19 or less, adult: Secondary | ICD-10-CM | POA: Diagnosis not present

## 2022-03-22 DIAGNOSIS — Z9221 Personal history of antineoplastic chemotherapy: Secondary | ICD-10-CM

## 2022-03-22 DIAGNOSIS — J9621 Acute and chronic respiratory failure with hypoxia: Secondary | ICD-10-CM | POA: Diagnosis present

## 2022-03-22 DIAGNOSIS — L8990 Pressure ulcer of unspecified site, unspecified stage: Secondary | ICD-10-CM | POA: Insufficient documentation

## 2022-03-22 DIAGNOSIS — Z7984 Long term (current) use of oral hypoglycemic drugs: Secondary | ICD-10-CM

## 2022-03-22 DIAGNOSIS — E119 Type 2 diabetes mellitus without complications: Secondary | ICD-10-CM | POA: Diagnosis not present

## 2022-03-22 DIAGNOSIS — L89312 Pressure ulcer of right buttock, stage 2: Secondary | ICD-10-CM | POA: Diagnosis present

## 2022-03-22 DIAGNOSIS — E872 Acidosis, unspecified: Secondary | ICD-10-CM | POA: Diagnosis present

## 2022-03-22 DIAGNOSIS — Z515 Encounter for palliative care: Secondary | ICD-10-CM

## 2022-03-22 DIAGNOSIS — G8929 Other chronic pain: Secondary | ICD-10-CM | POA: Diagnosis present

## 2022-03-22 DIAGNOSIS — R6520 Severe sepsis without septic shock: Secondary | ICD-10-CM | POA: Diagnosis present

## 2022-03-22 DIAGNOSIS — Z923 Personal history of irradiation: Secondary | ICD-10-CM

## 2022-03-22 DIAGNOSIS — I4891 Unspecified atrial fibrillation: Secondary | ICD-10-CM | POA: Diagnosis present

## 2022-03-22 DIAGNOSIS — J439 Emphysema, unspecified: Secondary | ICD-10-CM | POA: Diagnosis not present

## 2022-03-22 DIAGNOSIS — F17200 Nicotine dependence, unspecified, uncomplicated: Secondary | ICD-10-CM | POA: Diagnosis present

## 2022-03-22 DIAGNOSIS — K219 Gastro-esophageal reflux disease without esophagitis: Secondary | ICD-10-CM | POA: Diagnosis present

## 2022-03-22 DIAGNOSIS — E876 Hypokalemia: Secondary | ICD-10-CM | POA: Diagnosis present

## 2022-03-22 DIAGNOSIS — Z91030 Bee allergy status: Secondary | ICD-10-CM

## 2022-03-22 DIAGNOSIS — J690 Pneumonitis due to inhalation of food and vomit: Secondary | ICD-10-CM | POA: Diagnosis present

## 2022-03-22 DIAGNOSIS — I480 Paroxysmal atrial fibrillation: Secondary | ICD-10-CM | POA: Diagnosis present

## 2022-03-22 DIAGNOSIS — Z7189 Other specified counseling: Secondary | ICD-10-CM

## 2022-03-22 DIAGNOSIS — F419 Anxiety disorder, unspecified: Secondary | ICD-10-CM | POA: Diagnosis present

## 2022-03-22 DIAGNOSIS — E871 Hypo-osmolality and hyponatremia: Secondary | ICD-10-CM | POA: Diagnosis present

## 2022-03-22 DIAGNOSIS — Z7951 Long term (current) use of inhaled steroids: Secondary | ICD-10-CM

## 2022-03-22 DIAGNOSIS — F17210 Nicotine dependence, cigarettes, uncomplicated: Secondary | ICD-10-CM | POA: Diagnosis present

## 2022-03-22 DIAGNOSIS — Z79899 Other long term (current) drug therapy: Secondary | ICD-10-CM

## 2022-03-22 DIAGNOSIS — L89302 Pressure ulcer of unspecified buttock, stage 2: Secondary | ICD-10-CM | POA: Diagnosis not present

## 2022-03-22 DIAGNOSIS — Z66 Do not resuscitate: Secondary | ICD-10-CM | POA: Diagnosis present

## 2022-03-22 DIAGNOSIS — Z888 Allergy status to other drugs, medicaments and biological substances status: Secondary | ICD-10-CM

## 2022-03-22 DIAGNOSIS — Z7901 Long term (current) use of anticoagulants: Secondary | ICD-10-CM

## 2022-03-22 LAB — COMPREHENSIVE METABOLIC PANEL
ALT: 68 U/L — ABNORMAL HIGH (ref 0–44)
AST: 57 U/L — ABNORMAL HIGH (ref 15–41)
Albumin: 3.4 g/dL — ABNORMAL LOW (ref 3.5–5.0)
Alkaline Phosphatase: 55 U/L (ref 38–126)
Anion gap: 14 (ref 5–15)
BUN: 22 mg/dL (ref 8–23)
CO2: 19 mmol/L — ABNORMAL LOW (ref 22–32)
Calcium: 9.9 mg/dL (ref 8.9–10.3)
Chloride: 100 mmol/L (ref 98–111)
Creatinine, Ser: 0.79 mg/dL (ref 0.61–1.24)
GFR, Estimated: >60 mL/min (ref >60)
Glucose, Bld: 258 mg/dL — ABNORMAL HIGH (ref 70–99)
Potassium: 3.5 mmol/L (ref 3.5–5.1)
Sodium: 133 mmol/L — ABNORMAL LOW (ref 135–145)
Total Bilirubin: 0.6 mg/dL (ref 0.3–1.2)
Total Protein: 8.7 g/dL — ABNORMAL HIGH (ref 6.5–8.1)

## 2022-03-22 LAB — TROPONIN I (HIGH SENSITIVITY)
Troponin I (High Sensitivity): 7 ng/L (ref <18)
Troponin I (High Sensitivity): 6 ng/L (ref <18)

## 2022-03-22 LAB — CBC WITH DIFFERENTIAL/PLATELET
Abs Immature Granulocytes: 0.12 10*3/uL — ABNORMAL HIGH (ref 0.00–0.07)
Basophils Absolute: 0.1 10*3/uL (ref 0.0–0.1)
Basophils Relative: 0 %
Eosinophils Absolute: 0.1 10*3/uL (ref 0.0–0.5)
Eosinophils Relative: 1 %
HCT: 40.1 % (ref 39.0–52.0)
Hemoglobin: 12.5 g/dL — ABNORMAL LOW (ref 13.0–17.0)
Immature Granulocytes: 1 %
Lymphocytes Relative: 4 %
Lymphs Abs: 0.7 10*3/uL (ref 0.7–4.0)
MCH: 27.3 pg (ref 26.0–34.0)
MCHC: 31.2 g/dL (ref 30.0–36.0)
MCV: 87.6 fL (ref 80.0–100.0)
Monocytes Absolute: 0.4 10*3/uL (ref 0.1–1.0)
Monocytes Relative: 2 %
Neutro Abs: 17.2 10*3/uL — ABNORMAL HIGH (ref 1.7–7.7)
Neutrophils Relative %: 92 %
Platelets: 594 10*3/uL — ABNORMAL HIGH (ref 150–400)
RBC: 4.58 MIL/uL (ref 4.22–5.81)
RDW: 17.4 % — ABNORMAL HIGH (ref 11.5–15.5)
WBC: 18.5 10*3/uL — ABNORMAL HIGH (ref 4.0–10.5)
nRBC: 0 % (ref 0.0–0.2)

## 2022-03-22 LAB — PROCALCITONIN: Procalcitonin: <0.1 ng/mL

## 2022-03-22 LAB — BLOOD GAS, ARTERIAL
Acid-base deficit: 5.5 mmol/L — ABNORMAL HIGH (ref 0.0–2.0)
Bicarbonate: 16.7 mmol/L — ABNORMAL LOW (ref 20.0–28.0)
Delivery systems: POSITIVE
Expiratory PAP: 8 cmH2O
FIO2: 50 %
Inspiratory PAP: 16 cmH2O
O2 Saturation: 99.1 %
PEEP: 8 cmH2O
Patient temperature: 37
RATE: 10 resp/min
pCO2 arterial: 24 mmHg — ABNORMAL LOW (ref 32–48)
pH, Arterial: 7.45 (ref 7.35–7.45)
pO2, Arterial: 95 mmHg (ref 83–108)

## 2022-03-22 LAB — PROTIME-INR
INR: 1.1 (ref 0.8–1.2)
Prothrombin Time: 14.4 seconds (ref 11.4–15.2)

## 2022-03-22 LAB — MAGNESIUM: Magnesium: 1.4 mg/dL — ABNORMAL LOW (ref 1.7–2.4)

## 2022-03-22 LAB — LACTIC ACID, PLASMA
Lactic Acid, Venous: 4.5 mmol/L (ref 0.5–1.9)
Lactic Acid, Venous: 2.1 mmol/L (ref 0.5–1.9)

## 2022-03-22 LAB — GLUCOSE, CAPILLARY
Glucose-Capillary: 329 mg/dL — ABNORMAL HIGH (ref 70–99)
Glucose-Capillary: 320 mg/dL — ABNORMAL HIGH (ref 70–99)
Glucose-Capillary: 314 mg/dL — ABNORMAL HIGH (ref 70–99)
Glucose-Capillary: 141 mg/dL — ABNORMAL HIGH (ref 70–99)

## 2022-03-22 LAB — BLOOD GAS, VENOUS
Acid-base deficit: 4.1 mmol/L — ABNORMAL HIGH (ref 0.0–2.0)
Bicarbonate: 19.8 mmol/L — ABNORMAL LOW (ref 20.0–28.0)
Delivery systems: POSITIVE
FIO2: 50 %
O2 Saturation: 36.2 %
Patient temperature: 37
pCO2, Ven: 32 mmHg — ABNORMAL LOW (ref 44–60)
pH, Ven: 7.4 (ref 7.25–7.43)
pO2, Ven: <31 mmHg — CL (ref 32–45)

## 2022-03-22 LAB — BRAIN NATRIURETIC PEPTIDE: B Natriuretic Peptide: 168.7 pg/mL — ABNORMAL HIGH (ref 0.0–100.0)

## 2022-03-22 LAB — CBG MONITORING, ED: Glucose-Capillary: 334 mg/dL — ABNORMAL HIGH (ref 70–99)

## 2022-03-22 LAB — MRSA NEXT GEN BY PCR, NASAL: MRSA by PCR Next Gen: NOT DETECTED

## 2022-03-22 LAB — T4, FREE: Free T4: 1.63 ng/dL — ABNORMAL HIGH (ref 0.61–1.12)

## 2022-03-22 LAB — APTT: aPTT: 25 seconds (ref 24–36)

## 2022-03-22 LAB — SARS CORONAVIRUS 2 BY RT PCR: SARS Coronavirus 2 by RT PCR: NEGATIVE

## 2022-03-22 LAB — TSH: TSH: 0.374 u[IU]/mL (ref 0.350–4.500)

## 2022-03-22 MED ORDER — METHYLPREDNISOLONE SODIUM SUCC 125 MG IJ SOLR
125.0000 mg | Freq: Once | INTRAMUSCULAR | Status: AC
Start: 2022-03-22 — End: 2022-03-22
  Administered 2022-03-22: 125 mg via INTRAVENOUS
  Filled 2022-03-22: qty 2

## 2022-03-22 MED ORDER — AMPICILLIN-SULBACTAM SODIUM 3 (2-1) G IJ SOLR
3.0000 g | Freq: Four times a day (QID) | INTRAMUSCULAR | Status: DC
  Administered 2022-03-22 – 2022-03-26 (×16): 3 g via INTRAVENOUS
  Filled 2022-03-22 (×3): qty 8
  Filled 2022-03-22: qty 3
  Filled 2022-03-22 (×6): qty 8
  Filled 2022-03-22: qty 3
  Filled 2022-03-22 (×3): qty 8
  Filled 2022-03-22 (×2): qty 3
  Filled 2022-03-22 (×3): qty 8
  Filled 2022-03-22: qty 3

## 2022-03-22 MED ORDER — LACTATED RINGERS IV SOLN
INTRAVENOUS | Status: DC
  Administered 2022-03-22: 125 mL/h via INTRAVENOUS

## 2022-03-22 MED ORDER — CHLORHEXIDINE GLUCONATE CLOTH 2 % EX PADS
6.0000 | MEDICATED_PAD | Freq: Every day | CUTANEOUS | Status: DC
  Administered 2022-03-23 – 2022-03-26 (×4): 6 via TOPICAL

## 2022-03-22 MED ORDER — ONDANSETRON HCL 4 MG PO TABS: 4.0000 mg | ORAL_TABLET | Freq: Four times a day (QID) | ORAL | Status: DC | PRN

## 2022-03-22 MED ORDER — BUDESONIDE 0.25 MG/2ML IN SUSP
0.2500 mg | Freq: Two times a day (BID) | RESPIRATORY_TRACT | Status: DC
  Administered 2022-03-22 – 2022-03-26 (×8): 0.25 mg via RESPIRATORY_TRACT
  Filled 2022-03-22 (×8): qty 2

## 2022-03-22 MED ORDER — PANTOPRAZOLE SODIUM 40 MG IV SOLR
40.0000 mg | INTRAVENOUS | Status: DC
  Administered 2022-03-22 – 2022-03-26 (×5): 40 mg via INTRAVENOUS
  Filled 2022-03-22 (×5): qty 10

## 2022-03-22 MED ORDER — DILTIAZEM HCL 25 MG/5ML IV SOLN
20.0000 mg | Freq: Once | INTRAVENOUS | Status: DC
  Filled 2022-03-22: qty 5

## 2022-03-22 MED ORDER — SODIUM CHLORIDE 0.9 % IV BOLUS
500.0000 mL | Freq: Once | INTRAVENOUS | Status: AC
  Administered 2022-03-22: 500 mL via INTRAVENOUS

## 2022-03-22 MED ORDER — METRONIDAZOLE 500 MG/100ML IV SOLN
500.0000 mg | Freq: Once | INTRAVENOUS | Status: AC
  Administered 2022-03-22: 500 mg via INTRAVENOUS
  Filled 2022-03-22: qty 100

## 2022-03-22 MED ORDER — SODIUM CHLORIDE 0.9 % IV BOLUS
250.0000 mL | Freq: Once | INTRAVENOUS | Status: AC
  Administered 2022-03-22: 250 mL via INTRAVENOUS

## 2022-03-22 MED ORDER — CEFEPIME HCL 2 G IV SOLR
2.0000 g | Freq: Once | INTRAVENOUS | Status: AC
  Administered 2022-03-22: 2 g via INTRAVENOUS
  Filled 2022-03-22: qty 12.5

## 2022-03-22 MED ORDER — VANCOMYCIN HCL IN DEXTROSE 1-5 GM/200ML-% IV SOLN
1000.0000 mg | Freq: Once | INTRAVENOUS | Status: AC
Start: 2022-03-22 — End: 2022-03-22
  Administered 2022-03-22: 1000 mg via INTRAVENOUS
  Filled 2022-03-22: qty 200

## 2022-03-22 MED ORDER — ENOXAPARIN SODIUM 40 MG/0.4ML IJ SOSY: 40.0000 mg | PREFILLED_SYRINGE | INTRAMUSCULAR | Status: DC

## 2022-03-22 MED ORDER — DILTIAZEM HCL 25 MG/5ML IV SOLN
20.0000 mg | Freq: Once | INTRAVENOUS | Status: AC
  Administered 2022-03-22: 20 mg via INTRAVENOUS
  Filled 2022-03-22: qty 5

## 2022-03-22 MED ORDER — DILTIAZEM HCL-DEXTROSE 125-5 MG/125ML-% IV SOLN (PREMIX)
5.0000 mg/h | INTRAVENOUS | Status: DC
  Administered 2022-03-22: 5 mg/h via INTRAVENOUS
  Administered 2022-03-22: 10 mg/h via INTRAVENOUS
  Administered 2022-03-22: 15 mg/h via INTRAVENOUS
  Administered 2022-03-23 – 2022-03-24 (×2): 10 mg/h via INTRAVENOUS
  Administered 2022-03-24 – 2022-03-26 (×3): 7.5 mg/h via INTRAVENOUS
  Filled 2022-03-22 (×7): qty 125

## 2022-03-22 MED ORDER — INSULIN ASPART 100 UNIT/ML IJ SOLN
0.0000 [IU] | INTRAMUSCULAR | Status: DC
  Administered 2022-03-22: 2 [IU] via SUBCUTANEOUS
  Administered 2022-03-22 (×2): 11 [IU] via SUBCUTANEOUS
  Administered 2022-03-23 (×3): 3 [IU] via SUBCUTANEOUS
  Administered 2022-03-23 – 2022-03-24 (×2): 2 [IU] via SUBCUTANEOUS
  Administered 2022-03-24 (×5): 3 [IU] via SUBCUTANEOUS
  Administered 2022-03-25 (×2): 2 [IU] via SUBCUTANEOUS
  Administered 2022-03-25: 3 [IU] via SUBCUTANEOUS
  Administered 2022-03-25 (×2): 2 [IU] via SUBCUTANEOUS
  Administered 2022-03-26: 5 [IU] via SUBCUTANEOUS
  Administered 2022-03-26 (×3): 3 [IU] via SUBCUTANEOUS
  Filled 2022-03-22 (×20): qty 1

## 2022-03-22 MED ORDER — ENOXAPARIN SODIUM 40 MG/0.4ML IJ SOSY
40.0000 mg | PREFILLED_SYRINGE | INTRAMUSCULAR | Status: DC
  Administered 2022-03-22 – 2022-03-25 (×4): 40 mg via SUBCUTANEOUS
  Filled 2022-03-22 (×4): qty 0.4

## 2022-03-22 MED ORDER — ONDANSETRON HCL 4 MG/2ML IJ SOLN
4.0000 mg | Freq: Four times a day (QID) | INTRAMUSCULAR | Status: DC | PRN
  Filled 2022-03-22: qty 2

## 2022-03-22 MED ORDER — DILTIAZEM HCL 25 MG/5ML IV SOLN
10.0000 mg | Freq: Once | INTRAVENOUS | Status: AC
  Administered 2022-03-22: 10 mg via INTRAVENOUS

## 2022-03-22 MED ORDER — METHYLPREDNISOLONE SODIUM SUCC 125 MG IJ SOLR
80.0000 mg | INTRAMUSCULAR | Status: DC
  Administered 2022-03-23 – 2022-03-26 (×4): 80 mg via INTRAVENOUS
  Filled 2022-03-22 (×4): qty 2

## 2022-03-22 MED ORDER — MORPHINE SULFATE (PF) 2 MG/ML IV SOLN
2.0000 mg | INTRAVENOUS | Status: DC | PRN
  Administered 2022-03-22 – 2022-03-23 (×3): 2 mg via INTRAVENOUS
  Filled 2022-03-22 (×4): qty 1

## 2022-03-22 MED ORDER — MORPHINE SULFATE (PF) 2 MG/ML IV SOLN
1.0000 mg | Freq: Once | INTRAVENOUS | Status: AC
  Administered 2022-03-22: 1 mg via INTRAVENOUS
  Filled 2022-03-22: qty 1

## 2022-03-22 MED ORDER — IPRATROPIUM-ALBUTEROL 0.5-2.5 (3) MG/3ML IN SOLN
3.0000 mL | Freq: Four times a day (QID) | RESPIRATORY_TRACT | Status: DC
Start: 2022-03-22 — End: 2022-03-24
  Administered 2022-03-22 – 2022-03-24 (×10): 3 mL via RESPIRATORY_TRACT
  Filled 2022-03-22 (×9): qty 3

## 2022-03-22 MED ORDER — SODIUM CHLORIDE 0.9 % IV BOLUS
1000.0000 mL | Freq: Once | INTRAVENOUS | Status: AC
  Administered 2022-03-22: 1000 mL via INTRAVENOUS

## 2022-03-22 MED ORDER — NICOTINE 7 MG/24HR TD PT24
7.0000 mg | MEDICATED_PATCH | Freq: Every day | TRANSDERMAL | Status: DC
  Administered 2022-03-22 – 2022-03-26 (×5): 7 mg via TRANSDERMAL
  Filled 2022-03-22 (×5): qty 1

## 2022-03-22 MED ORDER — SODIUM BICARBONATE 8.4 % IV SOLN
50.0000 meq | Freq: Once | INTRAVENOUS | Status: AC
  Administered 2022-03-22: 50 meq via INTRAVENOUS
  Filled 2022-03-22: qty 50

## 2022-03-22 MED ORDER — MAGNESIUM SULFATE 2 GM/50ML IV SOLN
2.0000 g | Freq: Once | INTRAVENOUS | Status: AC
  Administered 2022-03-22: 2 g via INTRAVENOUS
  Filled 2022-03-22: qty 50

## 2022-03-22 MED ORDER — DIPHENHYDRAMINE HCL 50 MG/ML IJ SOLN
12.5000 mg | Freq: Every day | INTRAMUSCULAR | Status: AC
  Administered 2022-03-22: 12.5 mg via INTRAVENOUS
  Filled 2022-03-22: qty 1

## 2022-03-22 MED ORDER — IPRATROPIUM-ALBUTEROL 0.5-2.5 (3) MG/3ML IN SOLN: 3.0000 mL | Freq: Four times a day (QID) | RESPIRATORY_TRACT | Status: DC | PRN

## 2022-03-22 NOTE — Consult Note (Signed)
Pharmacy Antibiotic Note  Richard Adams is a 71 y.o. male admitted on 03/22/2022 with aspiration pneumonia. 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 Patient's wife states that he was treated for aspiration pneumonia with Augmentin and azithromycin and has completed antibiotic therapy (03/13/2022-03/19/2022). CXR shows bilateral nodular opacities which may reflect underlying malignancy as well as diffuse reticulonodular opacities throughout the right lung and left lower lobe which may reflect superimposed inflammation or infection. Pharmacy has been consulted for Unasyn dosing.  Labs: WBC 18.5, lactate 4.5>>2.1, procalcitonin <0.10  Plan: Start Unasyn 3g Q6H  Height: '6\' 1"'$  (185.4 cm) Weight: 57.8 kg (127 lb 6.8 oz) IBW/kg (Calculated) : 79.9  Temp (24hrs), Avg:97.8 F (36.6 C), Min:97.8 F (36.6 C), Max:97.8 F (36.6 C)  Recent Labs  Lab 03/22/22 0649 03/22/22 0651 03/22/22 0858  WBC  --  18.5*  --   CREATININE  --  0.79  --   LATICACIDVEN 4.5*  --  2.1*    Estimated Creatinine Clearance: 70.2 mL/min (by C-G formula based on SCr of 0.79 mg/dL).    Allergies  Allergen Reactions   Succinylcholine Other (See Comments)    Paralyzing for 3 days after last dose   Wasp Venom Protein Anaphylaxis   Sertraline Hcl Other (See Comments)    Felt like it burned the throat. Felt different in bad way   Naltrexone     Antimicrobials this admission: 03/22/2022 cefepime x1, vancomycin x1, flagyl x1 03/22/2022 unasyn >>   Dose adjustments this admission: N/A  Microbiology results: 03/22/2022 BCx: In process   Thank you for allowing pharmacy to be a part of this patient's care.  Gretel Acre, PharmD PGY1 Pharmacy Resident 03/22/2022 12:42 PM

## 2022-03-22 NOTE — ED Triage Notes (Signed)
Pt comes from home via acems c/o SOB. Pt 82% on RA at home. Placed on CPAP. O2 sat 93%. Aspiration pneumonia 1 wk ago. Hx of copd, afib, esophageal cancer.   Pt HR 89-171  93% CPAP 132/93

## 2022-03-22 NOTE — Consult Note (Signed)
CODE SEPSIS - PHARMACY COMMUNICATION  **Broad Spectrum Antibiotics should be administered within 1 hour of Sepsis diagnosis**  Time Code Sepsis Called/Page Received: 07:51  Antibiotics Ordered: cefepime and vancomycin  Time of 1st antibiotic administration: 07:50  Additional action taken by pharmacy: none   Gretel Acre ,PharmD Clinical Pharmacist  03/22/2022  8:10 AM

## 2022-03-22 NOTE — ED Provider Notes (Addendum)
[facility] Provider Note    None    (approximate)   History   Shortness of Breath   HPI  Richard Adams is a 71 y.o. male  COPD, esophageal cancer, possible recent aspiration PNA, Afib on flecanide.  According to patient's wife he was recently placed on Augmentin and finished a course for aspiration pneumonia.  He is got stage III or IV esophageal cancer currently on Keytruda.  They reported that he has been feeling more short of breath for the past few days but attributed to the aspiration pneumonia that got recent treatments however then he noted today that he was more short of breath than normal.  Patient feels like this is his COPD flaring up.  He denies any history of PEs, leg swelling, calf tenderness.  However according to wife he was taken off his Eliquis few months ago due to having issues with bleeding.  Reviewed the note from April 2023 where looks like patient had a GI bleed but patient left AMA but since then he has been off of his Eliquis.  Patient denies any falls hitting his head or any abdominal pain.  Reviewed noted from [facility] on 09/06/2021    Physical Exam   Triage Vital Signs: ED Triage Vitals  Enc Vitals Group     BP 03/22/22 0652 133/73     Pulse Rate 03/22/22 0652 (!) 158     Resp 03/22/22 0652 (!) 38     Temp 03/22/22 0652 97.8 F (36.6 C)     Temp Source 03/22/22 0652 Oral     SpO2 03/22/22 0650 96 %     Weight 03/22/22 0655 127 lb 6.8 oz (57.8 kg)     Height 03/22/22 0655 _0  (1.854 m)     Head Circumference --      Peak Flow --      Pain Score --      Pain Loc --      Pain Edu? --      Excl. in [place]? --     Most recent vital signs: Vitals:   03/22/22 0650 03/22/22 0652  BP:  133/73  Pulse:  (!) 158  Resp:  (!) 38  Temp:  97.8 F (36.6 C)  SpO2: 96% 96%     General: Awake, no distress.  CV:  Good peripheral perfusion.  Patient noted to be in A-fib with RVR Resp:  Normal effort.   Increased work of breathing with coarse breath sounds Abd:  No distention.  Other:  No swelling legs.  No calf tenderness   ED Results / Procedures / Treatments   Labs (all labs ordered are listed, but only abnormal results are displayed) Labs Reviewed  CBC WITH DIFFERENTIAL/PLATELET - Abnormal; Notable for the following components:      Result Value   WBC 18.5 (*)    Hemoglobin 12.5 (*)    RDW 17.4 (*)    Platelets 594 (*)    Neutro Abs 17.2 (*)    Abs Immature Granulocytes 0.12 (*)    All other components within normal limits  BLOOD GAS, VENOUS - Abnormal; Notable for the following components:   pCO2, Ven 32 (*)    pO2, Ven <31 (*)    Bicarbonate 19.8 (*)    Acid-base deficit 4.1 (*)    All other components within normal limits  CULTURE, BLOOD (ROUTINE X 2)  CULTURE, BLOOD (ROUTINE X 2)  URINE CULTURE  SARS CORONAVIRUS 2 BY  RT PCR  PROCALCITONIN  LACTIC ACID, PLASMA  LACTIC ACID, PLASMA  COMPREHENSIVE METABOLIC PANEL  URINALYSIS, COMPLETE (UACMP) WITH MICROSCOPIC  MAGNESIUM  BRAIN NATRIURETIC PEPTIDE  PROTIME-INR  APTT  TSH  T4, FREE  TROPONIN I (HIGH SENSITIVITY)     EKG  My interpretation of EKG:  A-fib with a rate of 180 without any ST elevation or T wave inversion with left intrafascicular block  Repeat EKG appears to be either a sinus tachycardia rate of 107 sinus versus atrial flutter, no ST elevation or T wave inversion.    RADIOLOGY I have reviewed the xray personally and interpreted the patient has bilateral opacities concerning for infection versus malignancy versus edema.  Heart size appears normal. No pleural effusion identified. Pleuroparenchymal scarring identified within the lateral right lung base. Signs of bilateral lower lobe bronchiectasis. Reticular and nodular opacities are identified throughout the right lung and left lower lobe. Bilateral nodular opacities are identified within the periphery of the right upper lobe, left and left  lower lobe. Within the right base there is a focal irregular opacity with surrounding architectural distortion. The visualized osseous structures appear unremarkable.   IMPRESSION: 1. Bilateral nodular opacities identified within the right lung and left lower lobe. These are indeterminate. Findings may reflect areas of multifocal pneumonia. Underlying malignancy would be difficult to exclude in this patient who has a history esophageal carcinoma. Consider more definitive characterization with CT of the chest. 2. Diffuse reticulonodular opacities are noted throughout the right lung and left lower lobe which may reflect superimposed inflammation or infection.   PROCEDURES:  Critical Care performed: Yes, see critical care procedure note(s)  .1-3 Lead EKG Interpretation  Performed by: Vanessa [place], MD Authorized by: Vanessa [place], MD     Interpretation: abnormal     ECG rate:  160   ECG rate assessment: tachycardic     Rhythm: atrial fibrillation     Ectopy: none     Conduction: normal   .Critical Care  Performed by: Vanessa [place], MD Authorized by: Vanessa [place], MD   Critical care provider statement:    Critical care time (minutes):  75   Critical care was necessary to treat or prevent imminent or life-threatening deterioration of the following conditions:  Respiratory failure   Critical care was time spent personally by me on the following activities:  Development of treatment plan with patient or surrogate, discussions with consultants, evaluation of patient's response to treatment, examination of patient, ordering and review of laboratory studies, ordering and review of radiographic studies, ordering and performing treatments and interventions, pulse oximetry, re-evaluation of patient's condition and review of old [place] ED: Medications  diltiazem (CARDIZEM) 125 mg in dextrose 5% 125 mL (1 mg/mL) infusion (has no administration in time range)   methylPREDNISolone sodium succinate (SOLU-MEDROL) 125 mg/2 mL injection 125 mg (125 mg Intravenous Given 03/22/22 0659)  diltiazem (CARDIZEM) injection 10 mg (10 mg Intravenous Given 03/22/22 0700)     IMPRESSION / MDM / [place] / ED COURSE  I reviewed the triage vital signs and the nursing notes.   Patient's presentation is most consistent with acute presentation with potential threat to life or bodily function.   Patient comes in with respiratory distress placed on BiPAP.  Patient noted to be in A-fib with RVR now on Eliquis therefore unable to cardiovert due to risk of stroke and unclear onset time given SOB for a past few days..  Diltiazem  ordered by off going provider and heart rates are slightly improving.  Patient given another 20 IV diltiazem. HR improved from 190 to 130 so dilt drip started.  His VBG is reassuring without any evidence of hypercapnia so I suspect this is more likely related to A-fib with RVR versus pulmonary edema versus aspiration PNA versus PE.  We will get the rest the blood work, x-ray to further evaluate.  7:44 AM X-ray concerning for possible bilateral opacifications.  This could be related to pneumonia his white count is elevated meet sepsis criteria.  We will start on broad-spectrum antibiotics.  I am going to hold off on fluids due to the concern that this could be secondary to A-fib with RVR and pulmonary edema.  Broad-spectrum antibiotics will be started including Flagyl to cover anaerobic coverage.   8:11 AM Lactate was elevated to 4.  I did a bedside ultrasound IVC seems compressible therefore we will trial 500 cc of fluid given his troponin is negative and BNP only slightly elevated.  If he is tolerating this can give additional fluid.  According to patient's wife patient does not eat or drink very well due to the esophageal cancer he looks very dry upon examination so I suspect that he is fluid down  9:40 AM attempted to take patient off of  BiPAP but patient desatted with increased work of breathing so placed back on.  Unable to do CT imaging at this time.  Troponin is stable.  Lactate is downtrending.  Therefore we will give an additional 1.250 L of fluid given heart rates are improving with fluid bolus.     Family initially discussed transfer to [facility] but given patient on BiPAP we discussed the risk of being in an ambulance.  He did not do well trialing off BiPAP.  Family would prefer to stay here given this.  9:55 AM On reassessment patient's lactate is downtrending.  Heart rates have come down and blood pressures have stabilized.  He seems to be responding well to the fluid.  Patient will be admitted to the hospital team for concern for pneumonia.  They can consider CT imaging while inpatient but at this time unable to take patient off of BiPAP to do it.  Given troponins are negative I have lower suspicion this is related to a blood clot he has had history of GI bleeding blood cultures in the past so we will hold off on blood thinner at this time    The patient is on the cardiac monitor to evaluate for evidence of arrhythmia and/or significant heart rate changes.      FINAL CLINICAL IMPRESSION(S) / ED DIAGNOSES   Final diagnoses:  Sepsis, due to unspecified organism, unspecified whether acute organ dysfunction present [facility])  Atrial fibrillation with rapid ventricular response ([place])     Rx / DC Orders   ED Discharge Orders     None        Note:  This document was prepared using Dragon voice recognition software and may include unintentional dictation errors.   Vanessa [place], MD 03/22/22 1308    Vanessa Crock[place], MD 03/22/22 [phone]

## 2022-03-22 NOTE — Progress Notes (Signed)
Pt trailed off BiPAP at MD request. Pt desat into low 80's and RR increased into 40's. MD aware

## 2022-03-22 NOTE — Assessment & Plan Note (Signed)
Patient has a history of chronic back pain and is on morphine oral and oxycodone at home. We will place patient on IV morphine while n.p.o.

## 2022-03-22 NOTE — Assessment & Plan Note (Signed)
Most likely secondary to aspiration pneumonia Patient was hypoxic in the field with room air pulse oximetry of 82% associated with increased work of breathing and tachypnea. He is currently on a BiPAP to reduce work of breathing and improve oxygenation. We will attempt to wean off BiPAP once acute illness improves or resolves.

## 2022-03-22 NOTE — Assessment & Plan Note (Signed)
Patient with a history of paroxysmal A-fib not on anticoagulation due to life-threatening GI bleed. Noted to be in rapid A-fib upon presentation most likely secondary to sepsis. Continue Cardizem drip initiated in the ER Hold flecainide and metoprolol for now until patient is able to resume p.o.

## 2022-03-22 NOTE — Sepsis Progress Note (Signed)
Notified provider of need to consider ordering additional fluid bolus for LA 4.5. Provider response is to start with gentle hydration due to possible CHF on xray and new afib rvr.

## 2022-03-22 NOTE — ED Notes (Signed)
1L NS hung per MD Jari Pigg Verbal

## 2022-03-22 NOTE — ED Notes (Signed)
Critical lab - [place] 4.5

## 2022-03-22 NOTE — Assessment & Plan Note (Addendum)
As evidenced by tachycardia, tachypnea, marked leukocytosis, lactic acidosis and chest x-ray findings suggestive of aspiration pneumonia. Patient also has acute respiratory failure secondary to aspiration pneumonia Continue aggressive IV fluid resuscitation Trend lactic acid levels Place patient on IV Unasyn N.p.o. for now Follow-up results of blood cultures

## 2022-03-22 NOTE — Assessment & Plan Note (Signed)
Keep patient n.p.o. for now Hold metformin Check blood sugars every 4 hours

## 2022-03-22 NOTE — Consult Note (Signed)
NAME:  Richard Adams, MRN:  [id number], DOB:  07-09-1951, LOS: 0 ADMISSION DATE:  03/22/2022, CONSULTATION DATE: 03/22/22 REFERRING MD: Dr. Francine Graven, CHIEF COMPLAINT: Shortness of Breath    History of Present Illness:  This is a 71 yo male who presented to [facility] ER on 08/5 via EMS with shortness of breath.  He has a hx of recurrent esophageal cancer currently receiving palliative chemotherapy pembrolizumab every 3 weeks.  He has had difficulty swallowing, and has only been able to consume very soft solids.  He has also had an unintentional 20 pound weight loss over the past 3 months. He was diagnosed with aspiration pneumonia on 03/13/22 by his outpatient oncologist, and prescribed augmentin/azithromycin.    EMS reported when they arrived at pts home today his O2 sats were 82% on RA, and he was placed on CPAP with O2 sats increasing to 92%.  ED Course  In the ER pt remained in respiratory distress requiring Bipap.  EKG revealed atrial fibrillation with rvr, heart rate 180's.  He received 10 mg of iv cardizem without improvement of heart rate followed by 20 mg iv cardizem.  Cardizem gtt initiated due to continued atrial fibrillation with rvr.  Lab results were: Na+ 133, CO2 19, glucose 258, mag 1.4, albumin 3.4, AST 57, ALT 68, BNP 168.7, lactic acid 4.5, wbc 18.5, hgb 12.5, and COVID-19 negative.  CXR concerning for multifocal pneumonia, however unable to exclude malignancy.  Sepsis protocol initiated. Pt received solumedrol, 1,750 ml iv NS bolus, magnesium, vancomycin, metronidazole, and cefepime.  He was subsequently admitted to the stepdown unit per hospitalist team for additional workup and treatment.  PCCM team consulted to assist with management.   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 He completed neoadjuvant carbo/taxol/RT in June 2021. He was diagnosed with locally recurrent disease in March 2022 and has pulmonary nodules suspicious for metastatic disease.  Esophageal perforation radiographically contained and asymptomatic   Significant Hospital Events: Including procedures, antibiotic start and stop dates in addition to other pertinent events   08/5: Pt admitted to the stepdown unit with acute on chronic hypoxic respiratory failure secondary to aspiration pneumonia and atrial fibrillation with rvr requiring Bipap and cardizem gtt.  PCCM team consulted to assist with management   Interim History / Subjective:  Pt remains on Bipap '@50'$ % and complains of shortness of breath.  He also endorses 6-7/10 acute on chronic back pain despite receiving 2 mg of iv morphine.    Objective   Blood pressure 118/78, pulse 88, temperature (!) 97.1 F (36.2 C), temperature source Axillary, resp. rate (!) 27, height '6\' 1"'$  (1.854 m), weight 57.8 kg, SpO2 98 %.    FiO2 (%):  [50 %] 50 %   Intake/Output Summary (Last 24 hours) at 03/22/2022 1531 Last data filed at 03/22/2022 1011 Gross per 24 hour  Intake 950 ml  Output --  Net 950 ml   Filed Weights   03/22/22 0655  Weight: 57.8 kg    Examination: General: Acute on chronically ill appearing frail male, in mild respiratory distress on Bipap HENT: Supple, no JVD  Lungs: Faint rhonchi throughout, even, mild tachypnea  Cardiovascular: Irregular irregular, no r/9. 2+ radial/1+  distal pulses, no edema  Abdomen: +BS x4, soft, non tender, non distended  Extremities: Normal tone and moves all extremities  Neuro: Alert, HOH, following commands, confused at times  GU: Deferred   Resolved Hospital Problem list    Assessment & Plan:   Acute on chronic hypoxic respiratory failure secondary to  aspiration pneumonia and AECOPD  Hx: Pulmonary nodules suspicious for metastatic disease, tobacco use disorder  - Continue Bipap for now for hypoxia and dyspnea - Maintain O2 sats 88% to 92% - IV and nebulized steroids  - Scheduled and prn bronchodilator therapy  - Will need CT Chest to assess for possible metastatic disease - Smoking cessation counseling provided  - Nicotine patch  Aspiration pneumonia in the setting of dysphagia secondary to esophageal cancer  - Trend WBC and monitor fever curve - Trend PCT  - Follow cultures - Continue unasyn for now pending culture results - Keep NPO for now and once off Bipap will need speech consult prior to initiated diet due to risk of aspiration   Atrial fibrillation with rvr  - Continuous telemetry monitoring  - Continue cardizem gtt for now for rate control  - Unable to anticoagulate given previous upper GI bleed due to eliquis  - Resume outpatient metoprolol once able to safely take po's   Type II diabetes mellitus  - CBG's q4hrs  - SSI   Chronic pain  - Prn iv morphine for pain management  - Resume outpatient pain medication regimen once able to safely take po's    Best Practice (right click and "Reselect all SmartList Selections" daily)   Diet/type: NPO DVT prophylaxis: SCD GI prophylaxis: PPI Lines: N/A Foley:  N/A Code Status:  full code Last date of multidisciplinary goals of care discussion [N/A]  Code status discussed with pt and pts wife at this time pt to remain FULL CODE.  Will consult palliative care to discuss goals of treatment and code status   Labs   CBC: Recent Labs  Lab 03/22/22 0651  WBC 18.5*  NEUTROABS 17.2*  HGB 12.5*  HCT 40.1  MCV 87.6  PLT 594*    Basic Metabolic Panel: Recent Labs  Lab 03/22/22 0651  NA 133*  K 3.5  CL 100  CO2 19*  GLUCOSE 258*  BUN 22  CREATININE 0.79  CALCIUM 9.9  MG 1.4*   GFR: Estimated Creatinine Clearance: 70.2 mL/min (by C-G formula based on SCr of  0.79 mg/dL). Recent Labs  Lab 03/22/22 0649 03/22/22 0651 03/22/22 0858  PROCALCITON  --  <0.10  --   WBC  --  18.5*  --   LATICACIDVEN 4.5*  --  2.1*    Liver Function Tests: Recent Labs  Lab 03/22/22 0651  AST 57*  ALT 68*  ALKPHOS 55  BILITOT 0.6  PROT 8.7*  ALBUMIN 3.4*   No results for input(s): "LIPASE", "AMYLASE" in the last 168 hours. No results for input(s): "AMMONIA" in the last 168 hours.  ABG    Component Value Date/Time   HCO3 19.8 (L) 03/22/2022 0707   ACIDBASEDEF 4.1 (H) 03/22/2022 0707   O2SAT 36.2 03/22/2022 0707     Coagulation Profile: Recent Labs  Lab 03/22/22 0729  INR 1.1    Cardiac Enzymes: No results for input(s): "CKTOTAL", "CKMB", "CKMBINDEX", "TROPONINI" in the last 168 hours.  HbA1C: No results found for: "HGBA1C"  CBG: Recent Labs  Lab 03/22/22 1257 03/22/22 1428  GLUCAP 334* 329*    Review of Systems: Positives in BOLD  Gen: fever, chills, unintentional weight loss, chronic back pain,dysphagia, fatigue, night sweats HEENT: Denies blurred vision, double vision, hearing loss, tinnitus, sinus congestion, rhinorrhea, sore throat, neck stiffness, dysphagia PULM: shortness of breath, cough, sputum production, hemoptysis, wheezing CV: Denies chest pain, edema, orthopnea, paroxysmal nocturnal dyspnea, palpitations GI: Denies abdominal pain, nausea, vomiting, diarrhea, hematochezia, melena, constipation, change in bowel habits GU: Denies dysuria, hematuria, polyuria, oliguria, urethral discharge Endocrine: Denies hot or cold intolerance, polyuria, polyphagia or appetite change Derm: Denies rash, dry skin, scaling or peeling skin change Heme: Denies easy bruising, bleeding, bleeding gums Neuro: Denies headache, numbness, weakness, slurred speech, loss of memory or consciousness   Past Medical History:  He,  has a past medical history of Alcohol dependence with intoxication ([place]), Alcohol induced insomnia ([place]), Atrial  fibrillation with RVR ([place]), Chronic bronchitis ([place]), COPD (chronic obstructive pulmonary disease) ([place]), Diabetes mellitus without complication ([place]), ED (erectile dysfunction), GERD (gastroesophageal reflux disease), Headache, History of subdural hematoma, Hypertension, Migraine, Mixed hyperlipidemia, PAF (paroxysmal atrial fibrillation) ([place]), Screening for malignant neoplasm of respiratory organ, SOB (shortness of breath), Tobacco use disorder, Type 2 diabetes mellitus with peripheral neuropathy ([place]), and Vertigo.   Surgical History:   Past Surgical History:  Procedure Laterality Date   COLONOSCOPY     ESOPHAGOGASTRODUODENOSCOPY (EGD) WITH PROPOFOL N/A 12/14/2019   Procedure: ESOPHAGOGASTRODUODENOSCOPY (EGD) WITH PROPOFOL;  Surgeon: Toledo, Benay Pike, MD;  Location: [facility] ENDOSCOPY;  Service: Gastroenterology;  Laterality: N/A;   FLEXIBLE BRONCHOSCOPY W/ UPPER ENDOSCOPY       Social History:   reports that he has been smoking cigarettes. He has been smoking an average of 1 pack per day. He has never used smokeless tobacco. He reports current alcohol use. He reports that he does not use drugs.   Family History:  His family history is not on file.   Allergies Allergies  Allergen Reactions   Succinylcholine Other (See Comments)    Paralyzing for 3 days after last dose   Wasp Venom Protein Anaphylaxis   Sertraline Hcl Other (See Comments)    Felt like it burned the throat. Felt different in bad way   Naltrexone      Home Medications  Prior to Admission medications   Medication Sig Start Date End Date Taking? Authorizing Provider  acetaminophen (TYLENOL CHILDRENS) 160 MG/5ML suspension Take 20 mLs by mouth every 6 (six) hours as needed. 11/27/21  Yes [provider]  albuterol (VENTOLIN HFA) 108 (90 Base) MCG/ACT inhaler Inhale 2 puffs into the lungs every 6 (six) hours as needed. 08/20/21 08/20/22 Yes [provider]  benzonatate (TESSALON) 200 MG capsule Take 200 mg  by mouth 3 (three) times daily as needed. 12/12/21  Yes [provider]  dexamethasone 0.5 MG/5ML elixir Take 0.5 mLs by mouth in the morning and at bedtime. 03/13/22 03/23/22 Yes [provider]  DPH-Lido-AlHydr-MgHydr-Simeth (FIRST-MOUTHWASH BLM) SUSP Take 5 mLs by mouth in the morning and at bedtime. 03/13/22  Yes [provider]  flecainide (TAMBOCOR) 100 MG tablet Take 100 mg by mouth 2 (two) times daily.   Yes [provider]  folic acid (FOLVITE) 1 MG tablet Take 1 mg by mouth daily.   Yes [provider]  gabapentin (NEURONTIN) 250 MG/5ML solution Take 12 mLs by mouth in the morning, at noon, and at bedtime. 08/12/21  Yes [provider]  hydrocortisone 1 % ointment Apply 1 Application topically daily.   Yes [provider]  Levothyroxine Sodium (TIROSINT-SOL) 137 MCG/ML SOLN Take 137 mcg by  mouth in the morning. 11/22/21  Yes [provider]  loperamide (IMODIUM) 2 MG capsule Take 2 mg by mouth as needed for diarrhea or loose stools.   Yes [provider]  megestrol (MEGACE) 40 MG/ML suspension Take 10 mLs by mouth daily. 03/13/22 03/13/23 Yes [provider]  metFORMIN (GLUCOPHAGE) 500 MG tablet Take 500 mg by mouth 2 (two) times daily with a meal. 01/27/18 03/22/22 Yes [provider]  metoprolol succinate (TOPROL-XL) 25 MG 24 hr tablet Take 1 tablet by mouth daily. 03/04/18 03/22/22 Yes [provider]  miconazole (MICOTIN) 2 % cream Apply 1 Application topically 2 (two) times daily.   Yes [provider]  midodrine (PROAMATINE) 5 MG tablet Take 5 mg by mouth daily. 03/13/22 03/13/23 Yes [provider]  morphine (MS CONTIN) 30 MG 12 hr tablet Take 30 mg by mouth every 8 (eight) hours as needed. 03/17/22 03/23/22 Yes [provider]  ondansetron (ZOFRAN) 8 MG tablet Take 8 mg by mouth every 8 (eight) hours as needed. 01/14/22  Yes [provider]  oxyCODONE (OXY  IR/ROXICODONE) 5 MG immediate release tablet Take 10 mg by mouth every 4 (four) hours as needed. 03/06/22  Yes [provider]  pantoprazole (PROTONIX) 40 MG tablet Take 40 mg by mouth daily.   Yes [provider]  prochlorperazine (COMPAZINE) 10 MG tablet Take 10 mg by mouth every 6 (six) hours as needed. 01/14/22  Yes [provider]  rosuvastatin (CRESTOR) 5 MG tablet Take 5 mg by mouth 3 (three) times a week. 07/30/18  Yes [provider]  tamsulosin (FLOMAX) 0.4 MG CAPS capsule Take 0.4 mg by mouth daily. Take one-half hour after the same meal each day 03/21/22  Yes [provider]  temazepam (RESTORIL) 15 MG capsule Take 15 mg by mouth at bedtime as needed. 03/13/22 2022/04/21 Yes [provider]  thiamine (VITAMIN B1) 100 MG tablet Take 100 mg by mouth daily. 10/22/21 10/22/22 Yes [provider]  umeclidinium-vilanterol (ANORO ELLIPTA) 62.5-25 MCG/INH AEPB Inhale 1 puff into the lungs daily.   Yes [provider]  Wheat Dextrin (BENEFIBER DRINK MIX PO) Take 1 Bottle by mouth daily as needed (FIBER).   Yes [provider]  albuterol (PROVENTIL HFA;VENTOLIN HFA) 108 (90 Base) MCG/ACT inhaler Inhale into the lungs. 01/27/18 01/27/19  [provider]  apixaban (ELIQUIS) 5 MG TABS tablet Take by mouth. Patient not taking: Reported on 03/22/2022 11/17/17   [provider]  dronedarone (MULTAQ) 400 MG tablet Take by mouth 2 (two) times daily. Patient not taking: Reported on 03/22/2022 10/14/17   [provider]  EPINEPHrine 0.3 mg/0.3 mL IJ SOAJ injection as needed. 06/11/18   [provider]  famotidine (PEPCID) 20 MG tablet Take by mouth as needed. Patient not taking: Reported on 03/22/2022 06/11/18   [provider]  Magnesium Amino Acid Chelate 20 % POWD daily. Patient not taking: Reported on 03/22/2022    [provider]  nicotine (NICOTROL) 10 MG inhaler Begin 4 weeks before your  Quit Smoking Day (10 cartridges per day)  Inhale multiple small puffs into mouth to control urges Patient not taking: Reported on 03/22/2022 10/12/17   [provider]  polyethylene glycol powder (GLYCOLAX/MIRALAX) 17 GM/SCOOP powder Take by mouth.    [provider]  tiotropium (SPIRIVA HANDIHALER) 18 MCG inhalation capsule as needed. Patient not taking: Reported on 03/22/2022 10/15/17   [provider]  traZODone (DESYREL) 100 MG tablet Take by mouth  as needed. Patient not taking: Reported on 03/22/2022 07/30/18 09/04/18  [provider]  triamcinolone ointment (KENALOG) 0.1 % as needed. 11/20/17   [provider]     Critical care time: 63 minutes     Donell Beers, [place] Pager [phone] (please enter 7 digits) PCCM Consult Pager [phone] (please enter 7 digits)

## 2022-03-22 NOTE — ED Notes (Signed)
Pt stating they cannot breathe- Pt given emesis bag for coughing secretions. Pt bipap placed back on afterwards.

## 2022-03-22 NOTE — ED Notes (Signed)
Pt transported to ICU by RN and respiratory therapist with monitor and Bi-pap.

## 2022-03-22 NOTE — Assessment & Plan Note (Signed)
Patient continues to smoke cigarettes but according to the wife he is down to 1 to 4 cigarettes daily but he also vapes. We will place patient on a nicotine transdermal patch. Smoking cessation has been discussed with patient in detail

## 2022-03-22 NOTE — Assessment & Plan Note (Signed)
Place patient on as needed and scheduled bronchodilator therapy Place patient on inhaled steroids as well Continue oxygen supplementation to maintain pulse oximetry greater than 92%

## 2022-03-22 NOTE — H&P (Addendum)
History and Physical    Patient: Richard Adams VXY:[id number] DOB: 17-Mar-1951 DOA: 03/22/2022 DOS: the patient was seen and examined on 03/22/2022 PCP: [name], PA-C  Patient coming from: Home  Chief Complaint:  Chief Complaint  Patient presents with   Shortness of Breath   Most of the history was obtained from patient's wife at the bedside as he is on a BiPAP and unable to provide any history. HPI: Richard Adams is a 71 y.o. 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  Patient completed neoadjuvant chemoradiation therapy in June, 2021.  He was diagnosed with locally recurrent disease in March, 2022 and has pulmonary nodules suspicious for metastatic disease.  He is currently on single agent therapy with pembrolizumab which he gets every 3 weeks. Patient has worsening dysphagia and according to the wife he is only able to tolerate liquid diet at this time. Patient's wife also states that he was recently treated for aspiration pneumonia with Augmentin and azithromycin and has completed antibiotic therapy. Patient's wife called EMS earlier this morning because of worsening shortness of breath.  Per EMS patient had room air pulse oximetry of 82% and was placed on CPAP with improvement in his pulse oximetry to 93%.  He was noted to be in rapid A-fib with heart rate between 89 and 171. He has a cough productive of beige phlegm but denies having any fever or chills.  He has no nausea, no vomiting, no dizziness, no lightheadedness, no leg swelling, no headache, no blurred vision no focal deficit. Labs show an initial lactate of 4.5 which shows a downward trend as well as marked leukocytosis. Chest x-ray shows bilateral nodular opacities which  may reflect underlying malignancy as well as diffuse reticulonodular opacities throughout the right lung and left lower lobe which may reflect superimposed inflammation or infection. Patient received aggressive IV fluid resuscitation and antibiotic therapy and is currently on a Cardizem drip. He will be admitted to the hospital for further evaluation.    Review of Systems: unable to review all systems due to the inability of the patient to answer questions. Past Medical History:  Diagnosis Date   Alcohol dependence with intoxication ([place])    Alcohol induced insomnia (HCC)    Atrial fibrillation with RVR (HCC)    Chronic bronchitis (HCC)    COPD (chronic obstructive pulmonary disease) (HCC)    Diabetes mellitus without complication ([place])    ED (erectile dysfunction)    GERD (gastroesophageal reflux disease)    Headache    History of subdural hematoma    Hypertension    Migraine    Mixed hyperlipidemia    PAF (paroxysmal atrial fibrillation) ([place])    Screening for malignant neoplasm of respiratory organ    SOB (shortness of breath)    Tobacco use disorder    Type 2 diabetes mellitus with peripheral neuropathy (HCC)    Vertigo    Past Surgical History:  Procedure Laterality Date   COLONOSCOPY     ESOPHAGOGASTRODUODENOSCOPY (EGD) WITH PROPOFOL N/A 12/14/2019   Procedure: ESOPHAGOGASTRODUODENOSCOPY (EGD) WITH PROPOFOL;  Surgeon: Toledo, Benay Pike, MD;  Location: [facility] ENDOSCOPY;  Service: Gastroenterology;  Laterality: N/A;   FLEXIBLE BRONCHOSCOPY W/ UPPER ENDOSCOPY     Social History:  reports that he has been smoking cigarettes. He has been smoking an  average of 1 pack per day. He has never used smokeless tobacco. He reports current alcohol use. He reports that he does not use drugs.  Allergies  Allergen Reactions   Succinylcholine Other (See Comments)    Paralyzing for 3 days after last dose   Wasp Venom Protein Anaphylaxis   Sertraline Hcl Other (See Comments)    Felt like it  burned the throat. Felt different in bad way   Naltrexone     No family history on file.  Prior to Admission medications   Medication Sig Start Date End Date Taking? Authorizing Provider  flecainide (TAMBOCOR) 100 MG tablet Take 100 mg by mouth 2 (two) times daily.   Yes [provider]  folic acid (FOLVITE) 1 MG tablet Take 1 mg by mouth daily.   Yes [provider]  metFORMIN (GLUCOPHAGE) 500 MG tablet Take 500 mg by mouth 2 (two) times daily with a meal. 01/27/18 03/22/22 Yes [provider]  metoprolol succinate (TOPROL-XL) 25 MG 24 hr tablet Take 1 tablet by mouth daily. 03/04/18 03/22/22 Yes [provider]  pantoprazole (PROTONIX) 40 MG tablet Take 40 mg by mouth daily.   Yes [provider]  rosuvastatin (CRESTOR) 5 MG tablet Take 5 mg by mouth 3 (three) times a week. 07/30/18  Yes [provider]  umeclidinium-vilanterol (ANORO ELLIPTA) 62.5-25 MCG/INH AEPB Inhale 1 puff into the lungs daily.   Yes [provider]  albuterol (PROVENTIL HFA;VENTOLIN HFA) 108 (90 Base) MCG/ACT inhaler Inhale into the lungs. 01/27/18 01/27/19  [provider]  apixaban (ELIQUIS) 5 MG TABS tablet Take by mouth. Patient not taking: Reported on 03/22/2022 11/17/17   [provider]  dronedarone (MULTAQ) 400 MG tablet Take by mouth 2 (two) times daily. Patient not taking: Reported on 03/22/2022 10/14/17   [provider]  EPINEPHrine 0.3 mg/0.3 mL IJ SOAJ injection as needed. 06/11/18   [provider]  famotidine (PEPCID) 20 MG tablet Take by mouth as needed. Patient not taking: Reported on 03/22/2022 06/11/18   [provider]  Magnesium Amino Acid Chelate 20 % POWD daily. Patient not taking: Reported on 03/22/2022    [provider]  nicotine (NICOTROL) 10 MG inhaler Begin 4 weeks before your Quit Smoking Day (10 cartridges per day)  Inhale multiple small puffs into mouth to control urges Patient not  taking: Reported on 03/22/2022 10/12/17   [provider]  tiotropium (SPIRIVA HANDIHALER) 18 MCG inhalation capsule as needed. Patient not taking: Reported on 03/22/2022 10/15/17   [provider]  traZODone (DESYREL) 100 MG tablet Take by mouth as needed. Patient not taking: Reported on 03/22/2022 07/30/18 09/04/18  [provider]  triamcinolone ointment (KENALOG) 0.1 % as needed. 11/20/17   [provider]    Physical Exam: Vitals:   03/22/22 0925 03/22/22 0930 03/22/22 1000 03/22/22 1030  BP: 1'01/63 97/70 96/78 '$ 106/73  Pulse: (!) 108 (!) 104 (!) 106 88  Resp: (!) 29 (!) 29 (!) 27 (!) 32  Temp:      TempSrc:      SpO2: 95% 94% 97% 97%  Weight:      Height:       Physical Exam Vitals and nursing note reviewed.  Constitutional:      Comments: Chronically ill-appearing, thin and frail.  On BiPAP.  HENT:     Head: Normocephalic.     Mouth/Throat:     Mouth: Mucous membranes are moist.  Eyes:     Pupils: Pupils  are equal, round, and reactive to light.  Cardiovascular:     Rate and Rhythm: Tachycardia present. Rhythm irregular.  Pulmonary:     Effort: Tachypnea present.     Breath sounds: Examination of the right-upper field reveals rhonchi. Examination of the right-middle field reveals rhonchi. Examination of the left-middle field reveals rhonchi. Examination of the right-lower field reveals rhonchi. Examination of the left-lower field reveals rhonchi. Rhonchi present.  Abdominal:     General: Bowel sounds are normal.     Palpations: Abdomen is soft.  Musculoskeletal:        General: Normal range of motion.     Cervical back: Normal range of motion and neck supple.  Skin:    General: Skin is warm and dry.     Nails: Clubbing: hdd.  Neurological:     Mental Status: He is alert.     Motor: Weakness present.  Psychiatric:        Mood and Affect: Mood normal.        Behavior: Behavior normal.     Data Reviewed: Relevant notes from primary care  and specialist visits, past discharge summaries as available in EHR, including Care Everywhere. Prior diagnostic testing as pertinent to current admission diagnoses Updated medications and problem lists for reconciliation ED course, including vitals, labs, imaging, treatment and response to treatment Triage notes, nursing and pharmacy notes and ED provider's notes Notable results as noted in HPI Labs reviewed.  TSH 0.37, procalcitonin less than 0.10, troponin 6, lactic acid 4.5 >> 2.1, PT 14.4, INR 1.1,  Twelve-lead EKG shows A-fib with a rapid ventricular rate.  Left anterior fascicular block. There are no new results to review at this time.  Assessment and Plan: * Sepsis ([place]) As evidenced by tachycardia, tachypnea, marked leukocytosis, lactic acidosis and chest x-ray findings suggestive of aspiration pneumonia. Patient also has acute respiratory failure secondary to aspiration pneumonia Continue aggressive IV fluid resuscitation Trend lactic acid levels Place patient on IV Unasyn N.p.o. for now Follow-up results of blood cultures  Aspiration pneumonia ([place]) Secondary to dysphagia from known squamous cell carcinoma of the esophagus. Patient is only able to tolerate liquid diet at this time Recently treated as an outpatient for aspiration pneumonia We will place patient on IV Unasyn Keep n.p.o. for now Speech therapy consult when stable  Acute respiratory failure with hypoxia ([place]) Most likely secondary to aspiration pneumonia Patient was hypoxic in the field with room air pulse oximetry of 82% associated with increased work of breathing and tachypnea. He is currently on a BiPAP to reduce work of breathing and improve oxygenation. We will attempt to wean off BiPAP once acute illness improves or resolves.  Atrial fibrillation with RVR ([place]) Patient with a history of paroxysmal A-fib not on anticoagulation due to life-threatening GI bleed. Noted to be in rapid A-fib upon  presentation most likely secondary to sepsis. Continue Cardizem drip initiated in the ER Hold flecainide and metoprolol for now until patient is able to resume p.o.  COPD (chronic obstructive pulmonary disease) ([place]) Place patient on as needed and scheduled bronchodilator therapy Place patient on inhaled steroids as well Continue oxygen supplementation to maintain pulse oximetry greater than 92%  Tobacco use disorder Patient continues to smoke cigarettes but according to the wife he is down to 1 to 4 cigarettes daily but he also vapes. We will place patient on a nicotine transdermal patch. Smoking cessation has been discussed with patient in detail  Diabetes mellitus without complication ([place]) Keep patient n.p.o.  for now Hold metformin Check blood sugars every 4 hours  Chronic back pain Patient has a history of chronic back pain and is on morphine oral and oxycodone at home. We will place patient on IV morphine while n.p.o.      Advance Care Planning:   Code Status: Full Code   Consults: Palliative care, critical care  Family Communication: Greater than 50% of time was spent discussing patient's condition and plan of care with him and his wife at the bedside.  All questions and concerns have been addressed.  She verbalizes understanding and agrees with the plan.  CODE STATUS was discussed and he is a full code.  Severity of Illness: The appropriate patient status for this patient is INPATIENT. Inpatient status is judged to be reasonable and necessary in order to provide the required intensity of service to ensure the patient's safety. The patient's presenting symptoms, physical exam findings, and initial radiographic and laboratory data in the context of their chronic comorbidities is felt to place them at high risk for further clinical deterioration. Furthermore, it is not anticipated that the patient will be medically stable for discharge from the hospital within 2 midnights of  admission.   * I certify that at the point of admission it is my clinical judgment that the patient will require inpatient hospital care spanning beyond 2 midnights from the point of admission due to high intensity of service, high risk for further deterioration and high frequency of surveillance required.*  Author: Collier Bullock, MD 03/22/2022 11:42 AM  For on call review [email or web address].

## 2022-03-22 NOTE — ED Notes (Signed)
Informed RN bed assigned 

## 2022-03-22 NOTE — Sepsis Progress Note (Signed)
Sepsis protocol is being followed by [facility]. 

## 2022-03-22 NOTE — Consult Note (Signed)
PHARMACY -  BRIEF ANTIBIOTIC NOTE   Pharmacy has received consult(s) for sepsis from an ED provider.  The patient's profile has been reviewed for ht/wt/allergies/indication/available labs.    One time order(s) placed for cefepime and vancomycin.  Further antibiotics/pharmacy consults should be ordered by admitting physician if indicated.                       Thank you,  Gretel Acre, PharmD PGY1 Pharmacy Resident 03/22/2022 8:09 AM

## 2022-03-22 NOTE — Assessment & Plan Note (Signed)
Secondary to dysphagia from known squamous cell carcinoma of the esophagus. Patient is only able to tolerate liquid diet at this time Recently treated as an outpatient for aspiration pneumonia We will place patient on IV Unasyn Keep n.p.o. for now Speech therapy consult when stable

## 2022-03-23 ENCOUNTER — Encounter: Payer: Self-pay | Admitting: Internal Medicine

## 2022-03-23 DIAGNOSIS — L89302 Pressure ulcer of unspecified buttock, stage 2: Secondary | ICD-10-CM

## 2022-03-23 DIAGNOSIS — I4891 Unspecified atrial fibrillation: Secondary | ICD-10-CM | POA: Diagnosis not present

## 2022-03-23 DIAGNOSIS — J690 Pneumonitis due to inhalation of food and vomit: Secondary | ICD-10-CM | POA: Diagnosis not present

## 2022-03-23 DIAGNOSIS — J9601 Acute respiratory failure with hypoxia: Secondary | ICD-10-CM | POA: Diagnosis not present

## 2022-03-23 DIAGNOSIS — A419 Sepsis, unspecified organism: Secondary | ICD-10-CM | POA: Diagnosis not present

## 2022-03-23 LAB — RESPIRATORY PANEL BY PCR

## 2022-03-23 LAB — CBC
HCT: 28.1 % — ABNORMAL LOW (ref 39.0–52.0)
Hemoglobin: 9.1 g/dL — ABNORMAL LOW (ref 13.0–17.0)
MCH: 27.5 pg (ref 26.0–34.0)
MCHC: 32.4 g/dL (ref 30.0–36.0)
MCV: 84.9 fL (ref 80.0–100.0)
Platelets: 384 10*3/uL (ref 150–400)
RBC: 3.31 MIL/uL — ABNORMAL LOW (ref 4.22–5.81)
RDW: 17 % — ABNORMAL HIGH (ref 11.5–15.5)
WBC: 21.9 10*3/uL — ABNORMAL HIGH (ref 4.0–10.5)
nRBC: 0 % (ref 0.0–0.2)

## 2022-03-23 LAB — BASIC METABOLIC PANEL
Anion gap: 7 (ref 5–15)
BUN: 22 mg/dL (ref 8–23)
CO2: 23 mmol/L (ref 22–32)
Calcium: 9 mg/dL (ref 8.9–10.3)
Chloride: 107 mmol/L (ref 98–111)
Creatinine, Ser: 0.62 mg/dL (ref 0.61–1.24)
GFR, Estimated: >60 mL/min (ref >60)
Glucose, Bld: 106 mg/dL — ABNORMAL HIGH (ref 70–99)
Potassium: 3.4 mmol/L — ABNORMAL LOW (ref 3.5–5.1)
Sodium: 137 mmol/L (ref 135–145)

## 2022-03-23 LAB — CORTISOL-AM, BLOOD: Cortisol - AM: 17.4 ug/dL (ref 6.7–22.6)

## 2022-03-23 LAB — GLUCOSE, CAPILLARY
Glucose-Capillary: 106 mg/dL — ABNORMAL HIGH (ref 70–99)
Glucose-Capillary: 134 mg/dL — ABNORMAL HIGH (ref 70–99)
Glucose-Capillary: 178 mg/dL — ABNORMAL HIGH (ref 70–99)
Glucose-Capillary: 197 mg/dL — ABNORMAL HIGH (ref 70–99)
Glucose-Capillary: 154 mg/dL — ABNORMAL HIGH (ref 70–99)

## 2022-03-23 LAB — HIV ANTIBODY (ROUTINE TESTING W REFLEX): HIV Screen 4th Generation wRfx: NONREACTIVE

## 2022-03-23 LAB — PHOSPHORUS: Phosphorus: 2.1 mg/dL — ABNORMAL LOW (ref 2.5–4.6)

## 2022-03-23 LAB — PROTIME-INR
INR: 1.5 — ABNORMAL HIGH (ref 0.8–1.2)
Prothrombin Time: 17.7 seconds — ABNORMAL HIGH (ref 11.4–15.2)

## 2022-03-23 LAB — HEMOGLOBIN A1C
Hgb A1c MFr Bld: 5.7 % — ABNORMAL HIGH (ref 4.8–5.6)
Mean Plasma Glucose: 116.89 mg/dL

## 2022-03-23 LAB — LACTIC ACID, PLASMA: Lactic Acid, Venous: 1.3 mmol/L (ref 0.5–1.9)

## 2022-03-23 LAB — PROCALCITONIN: Procalcitonin: 3.91 ng/mL

## 2022-03-23 LAB — MAGNESIUM: Magnesium: 1.7 mg/dL (ref 1.7–2.4)

## 2022-03-23 MED ORDER — LACTATED RINGERS IV SOLN: INTRAVENOUS | Status: DC

## 2022-03-23 MED ORDER — HYDROMORPHONE HCL 1 MG/ML IJ SOLN
0.5000 mg | INTRAMUSCULAR | Status: DC | PRN
  Administered 2022-03-23 – 2022-03-26 (×15): 0.5 mg via INTRAVENOUS
  Filled 2022-03-23 (×17): qty 1

## 2022-03-23 MED ORDER — POTASSIUM CHLORIDE 10 MEQ/100ML IV SOLN
10.0000 meq | INTRAVENOUS | Status: AC
  Administered 2022-03-23 (×4): 10 meq via INTRAVENOUS
  Filled 2022-03-23 (×4): qty 100

## 2022-03-23 MED ORDER — LORAZEPAM 2 MG/ML IJ SOLN: 0.2500 mg | Freq: Once | INTRAMUSCULAR | Status: DC

## 2022-03-23 MED ORDER — KCL-LACTATED RINGERS 20 MEQ/L IV SOLN
INTRAVENOUS | Status: DC
  Filled 2022-03-23: qty 1000

## 2022-03-23 MED ORDER — MAGIC MOUTHWASH W/LIDOCAINE
15.0000 mL | Freq: Four times a day (QID) | ORAL | Status: DC
  Administered 2022-03-23 – 2022-03-24 (×3): 15 mL via ORAL
  Filled 2022-03-23 (×15): qty 15

## 2022-03-23 MED ORDER — MAGNESIUM SULFATE 2 GM/50ML IV SOLN
2.0000 g | Freq: Once | INTRAVENOUS | Status: AC
Start: 2022-03-23 — End: 2022-03-23
  Administered 2022-03-23: 2 g via INTRAVENOUS
  Filled 2022-03-23: qty 50

## 2022-03-23 MED ORDER — SODIUM CHLORIDE 0.45 % IV SOLN
INTRAVENOUS | Status: DC
  Filled 2022-03-23 (×4): qty 1000

## 2022-03-23 NOTE — Progress Notes (Signed)
Patient has requested ice chips and/or water multiple times, his diet orders were explained and all questions were answered. He is also requesting to be taken off the BiPap.    [facility] informed.

## 2022-03-23 NOTE — Progress Notes (Signed)
NAME:  Richard Adams, MRN:  [id number], DOB:  August 23, 1950, LOS: 1 ADMISSION DATE:  03/22/2022, CONSULTATION DATE: 03/22/22 REFERRING MD: Dr. Francine Graven, CHIEF COMPLAINT: Shortness of Breath    History of Present Illness:  This is a 71 yo male who presented to [facility] ER on 08/5 via EMS with shortness of breath.  He has a hx of recurrent esophageal cancer currently receiving palliative chemotherapy pembrolizumab every 3 weeks.  He has had difficulty swallowing, and has only been able to consume very soft solids.  He has also had an unintentional 20 pound weight loss over the past 3 months. He was diagnosed with aspiration pneumonia on 03/13/22 by his outpatient oncologist, and prescribed augmentin/azithromycin.    EMS reported when they arrived at pts home today his O2 sats were 82% on RA, and he was placed on CPAP with O2 sats increasing to 92%.  ED Course  In the ER pt remained in respiratory distress requiring Bipap.  EKG revealed atrial fibrillation with rvr, heart rate 180's.  He received 10 mg of iv cardizem without improvement of heart rate followed by 20 mg iv cardizem.  Cardizem gtt initiated due to continued atrial fibrillation with rvr.  Lab results were: Na+ 133, CO2 19, glucose 258, mag 1.4, albumin 3.4, AST 57, ALT 68, BNP 168.7, lactic acid 4.5, wbc 18.5, hgb 12.5, and COVID-19 negative.  CXR concerning for multifocal pneumonia, however unable to exclude malignancy.  Sepsis protocol initiated. Pt received solumedrol, 1,750 ml iv NS bolus, magnesium, vancomycin, metronidazole, and cefepime.  He was subsequently admitted to the stepdown unit per hospitalist team for additional workup and treatment.  PCCM team consulted to assist with management.   03/23/22- patient is off bipap and is not as confused as before.  He is unhappy with diet and requests to leava AMA. He is no longer requiring ICU and will be transitioned to stepdown on [facility] service.   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 He completed neoadjuvant carbo/taxol/RT in June 2021. He was diagnosed with locally recurrent disease in March 2022 and has pulmonary nodules suspicious for metastatic disease.  Esophageal perforation radiographically contained and asymptomatic   Significant Hospital Events: Including procedures, antibiotic start and stop dates in addition to other pertinent events   08/5: Pt admitted to the stepdown unit with acute on chronic hypoxic respiratory failure secondary to aspiration pneumonia and atrial fibrillation with rvr requiring Bipap and cardizem gtt.  PCCM team consulted to assist with management   Interim History / Subjective:  Pt remains on Bipap '@50'$ % and complains of shortness of breath.  He also endorses 6-7/10 acute on chronic back pain despite receiving 2 mg of iv morphine.    Objective   Blood pressure (!) 88/46, pulse 100, temperature 98.2 F (36.8 C), temperature source Axillary, resp. rate 17, height '6\' 1"'$  (1.854 m), weight 57.8 kg, SpO2 96 %.    FiO2 (%):  [50 %] 50 %   Intake/Output Summary (Last 24 hours) at 03/23/2022 1047 Last data filed at 03/23/2022 0900 Gross per 24 hour  Intake 621.51 ml  Output 800 ml  Net -178.49 ml    Filed Weights   03/22/22 0655  Weight: 57.8 kg    Examination: General: Acute on chronically ill appearing frail male, in mild respiratory distress on nasal canula HENT: Supple, no JVD  Lungs: Faint rhonchi throughout, even, mild tachypnea  Cardiovascular: Irregular irregular, no r/9. 2+ radial/1+ distal pulses, no edema  Abdomen: +BS x4, soft, non tender, non distended  Extremities: Normal tone and moves all  extremities  Neuro: Alert, HOH, following commands, confused at times  GU: Deferred   Resolved Hospital Problem list    Assessment & Plan:   Acute on chronic hypoxic respiratory failure secondary to aspiration pneumonia and AECOPD  Hx: Pulmonary nodules suspicious for metastatic disease, tobacco use disorder  - Continue supplemental O2 - Maintain O2 sats 88% to 92% - IV and nebulized steroids  - Scheduled and prn bronchodilator therapy  - Will need CT Chest to assess for possible metastatic disease - Smoking cessation counseling provided  - Nicotine patch  Aspiration pneumonia in the setting of dysphagia secondary to esophageal cancer  - Trend WBC and monitor fever curve - Trend PCT  - Follow cultures - Continue unasyn for now pending culture results - Keep NPO for now and once off Bipap will need speech consult prior to initiated diet due to risk of aspiration   Atrial fibrillation with rvr  - Continuous telemetry monitoring  - Continue cardizem gtt for now for rate control  - Unable to anticoagulate given previous upper GI bleed due to eliquis  - Resume outpatient metoprolol once able to safely take po's   Type II diabetes mellitus  - CBG's q4hrs  - SSI   Chronic pain  - Prn iv morphine for pain management  - Resume outpatient pain medication regimen once able to safely take po's    Best Practice (right click and "Reselect all SmartList Selections" daily)   Diet/type: NPO DVT prophylaxis: SCD GI prophylaxis: PPI Lines: N/A Foley:  N/A Code Status:  full code Last date of multidisciplinary goals of care discussion [N/A]  Code status discussed with pt and pts wife at this time pt to remain FULL CODE.  Will consult palliative care to discuss goals of treatment and code status   Labs   CBC: Recent Labs  Lab 03/22/22 0651 03/23/22 0420  WBC 18.5* 21.9*  NEUTROABS 17.2*  --   HGB 12.5* 9.1*  HCT 40.1 28.1*  MCV 87.6 84.9  PLT 594* 384     Basic Metabolic  Panel: Recent Labs  Lab 03/22/22 0651 03/23/22 0420  NA 133* 137  K 3.5 3.4*  CL 100 107  CO2 19* 23  GLUCOSE 258* 106*  BUN 22 22  CREATININE 0.79 0.62  CALCIUM 9.9 9.0  MG 1.4* 1.7  PHOS  --  2.1*    GFR: Estimated Creatinine Clearance: 70.2 mL/min (by C-G formula based on SCr of 0.62 mg/dL). Recent Labs  Lab 03/22/22 0649 03/22/22 0651 03/22/22 0858 03/23/22 0420  PROCALCITON  --  <0.10  --  3.91  WBC  --  18.5*  --  21.9*  LATICACIDVEN 4.5*  --  2.1*  --      Liver Function Tests: Recent Labs  Lab 03/22/22 0651  AST 57*  ALT 68*  ALKPHOS 55  BILITOT 0.6  PROT 8.7*  ALBUMIN 3.4*    No results for input(s): "LIPASE", "AMYLASE" in the last 168 hours. No results for input(s): "AMMONIA" in the last 168 hours.  ABG    Component Value Date/Time   PHART 7.45 03/22/2022 1513   PCO2ART  24 (L) 03/22/2022 1513   PO2ART 95 03/22/2022 1513   HCO3 16.7 (L) 03/22/2022 1513   ACIDBASEDEF 5.5 (H) 03/22/2022 1513   O2SAT 99.1 03/22/2022 1513     Coagulation Profile: Recent Labs  Lab 03/22/22 0729 03/23/22 0420  INR 1.1 1.5*     Cardiac Enzymes: No results for input(s): "CKTOTAL", "CKMB", "CKMBINDEX", "TROPONINI" in the last 168 hours.  HbA1C: Hgb A1c MFr Bld  Date/Time Value Ref Range Status  03/22/2022 06:01 PM 5.7 (H) 4.8 - 5.6 % Final    Comment:    (NOTE) Pre diabetes:          5.7%-6.4%  Diabetes:              >6.4%  Glycemic control for   <7.0% adults with diabetes     CBG: Recent Labs  Lab 03/22/22 1551 03/22/22 1912 03/22/22 2320 03/23/22 0359 03/23/22 0737  GLUCAP 320* 314* 141* 106* 134*     Review of Systems: Positives in BOLD   Gen: fever, chills, unintentional weight loss, chronic back pain,dysphagia, fatigue, night sweats HEENT: Denies blurred vision, double vision, hearing loss, tinnitus, sinus congestion, rhinorrhea, sore throat, neck stiffness, dysphagia PULM: shortness of breath, cough, sputum production,  hemoptysis, wheezing CV: Denies chest pain, edema, orthopnea, paroxysmal nocturnal dyspnea, palpitations GI: Denies abdominal pain, nausea, vomiting, diarrhea, hematochezia, melena, constipation, change in bowel habits GU: Denies dysuria, hematuria, polyuria, oliguria, urethral discharge Endocrine: Denies hot or cold intolerance, polyuria, polyphagia or appetite change Derm: Denies rash, dry skin, scaling or peeling skin change Heme: Denies easy bruising, bleeding, bleeding gums Neuro: Denies headache, numbness, weakness, slurred speech, loss of memory or consciousness   Past Medical History:  He,  has a past medical history of Alcohol dependence with intoxication ([place]), Alcohol induced insomnia ([place]), Atrial fibrillation with RVR ([place]), Chronic bronchitis ([place]), COPD (chronic obstructive pulmonary disease) ([place]), Diabetes mellitus without complication ([place]), ED (erectile dysfunction), GERD (gastroesophageal reflux disease), Headache, History of subdural hematoma, Hypertension, Migraine, Mixed hyperlipidemia, PAF (paroxysmal atrial fibrillation) ([place]), Screening for malignant neoplasm of respiratory organ, SOB (shortness of breath), Tobacco use disorder, Type 2 diabetes mellitus with peripheral neuropathy ([place]), and Vertigo.   Surgical History:   Past Surgical History:  Procedure Laterality Date   COLONOSCOPY     ESOPHAGOGASTRODUODENOSCOPY (EGD) WITH PROPOFOL N/A 12/14/2019   Procedure: ESOPHAGOGASTRODUODENOSCOPY (EGD) WITH PROPOFOL;  Surgeon: Toledo, Benay Pike, MD;  Location: [facility] ENDOSCOPY;  Service: Gastroenterology;  Laterality: N/A;   FLEXIBLE BRONCHOSCOPY W/ UPPER ENDOSCOPY       Social History:   reports that he has been smoking cigarettes. He has been smoking an average of 1 pack per day. He has never used smokeless tobacco. He reports current alcohol use. He reports that he does not use drugs.   Family History:  His family history is not on file.   Allergies Allergies  Allergen  Reactions   Succinylcholine Other (See Comments)    Paralyzing for 3 days after last dose   Wasp Venom Protein Anaphylaxis   Sertraline Hcl Other (See Comments)    Felt like it burned the throat. Felt different in bad way   Naltrexone      Home Medications  Prior to Admission medications   Medication Sig Start Date End Date Taking? Authorizing Provider  acetaminophen (TYLENOL CHILDRENS) 160 MG/5ML suspension Take 20 mLs by mouth every 6 (six) hours as needed. 11/27/21  Yes [provider]  albuterol (VENTOLIN HFA) 108 (90 Base) MCG/ACT inhaler Inhale 2 puffs  into the lungs every 6 (six) hours as needed. 08/20/21 08/20/22 Yes [provider]  benzonatate (TESSALON) 200 MG capsule Take 200 mg by mouth 3 (three) times daily as needed. 12/12/21  Yes [provider]  dexamethasone 0.5 MG/5ML elixir Take 0.5 mLs by mouth in the morning and at bedtime. 03/13/22 03/23/22 Yes [provider]  DPH-Lido-AlHydr-MgHydr-Simeth (FIRST-MOUTHWASH BLM) SUSP Take 5 mLs by mouth in the morning and at bedtime. 03/13/22  Yes [provider]  flecainide (TAMBOCOR) 100 MG tablet Take 100 mg by mouth 2 (two) times daily.   Yes [provider]  folic acid (FOLVITE) 1 MG tablet Take 1 mg by mouth daily.   Yes [provider]  gabapentin (NEURONTIN) 250 MG/5ML solution Take 12 mLs by mouth in the morning, at noon, and at bedtime. 08/12/21  Yes [provider]  hydrocortisone 1 % ointment Apply 1 Application topically daily.   Yes [provider]  Levothyroxine Sodium (TIROSINT-SOL) 137 MCG/ML SOLN Take 137 mcg by mouth in the morning. 11/22/21  Yes [provider]  loperamide (IMODIUM) 2 MG capsule Take 2 mg by mouth as needed for diarrhea or loose stools.   Yes [provider]  megestrol (MEGACE) 40 MG/ML suspension Take 10 mLs by mouth daily. 03/13/22 03/13/23 Yes [provider]  metFORMIN (GLUCOPHAGE) 500 MG tablet Take  500 mg by mouth 2 (two) times daily with a meal. 01/27/18 03/22/22 Yes [provider]  metoprolol succinate (TOPROL-XL) 25 MG 24 hr tablet Take 1 tablet by mouth daily. 03/04/18 03/22/22 Yes [provider]  miconazole (MICOTIN) 2 % cream Apply 1 Application topically 2 (two) times daily.   Yes [provider]  midodrine (PROAMATINE) 5 MG tablet Take 5 mg by mouth daily. 03/13/22 03/13/23 Yes [provider]  morphine (MS CONTIN) 30 MG 12 hr tablet Take 30 mg by mouth every 8 (eight) hours as needed. 03/17/22 03/23/22 Yes [provider]  ondansetron (ZOFRAN) 8 MG tablet Take 8 mg by mouth every 8 (eight) hours as needed. 01/14/22  Yes [provider]  oxyCODONE (OXY IR/ROXICODONE) 5 MG immediate release tablet Take 10 mg by mouth every 4 (four) hours as needed. 03/06/22  Yes [provider]  pantoprazole (PROTONIX) 40 MG tablet Take 40 mg by mouth daily.   Yes [provider]  prochlorperazine (COMPAZINE) 10 MG tablet Take 10 mg by mouth every 6 (six) hours as needed. 01/14/22  Yes [provider]  rosuvastatin (CRESTOR) 5 MG tablet Take 5 mg by mouth 3 (three) times a week. 07/30/18  Yes [provider]  tamsulosin (FLOMAX) 0.4 MG CAPS capsule Take 0.4 mg by mouth daily. Take one-half hour after the same meal each day 03/21/22  Yes [provider]  temazepam (RESTORIL) 15 MG capsule Take 15 mg by mouth at bedtime as needed. 03/13/22 2022-04-29 Yes [provider]  thiamine (VITAMIN B1) 100 MG tablet Take 100 mg by mouth daily. 10/22/21 10/22/22 Yes [provider]  umeclidinium-vilanterol (ANORO ELLIPTA) 62.5-25 MCG/INH AEPB Inhale 1 puff into the lungs daily.   Yes [provider]  Wheat Dextrin (BENEFIBER DRINK MIX PO) Take 1 Bottle by mouth daily as needed (FIBER).   Yes [provider]  albuterol (PROVENTIL HFA;VENTOLIN HFA) 108 (90 Base) MCG/ACT inhaler Inhale into the lungs. 01/27/18  01/27/19  [provider]  apixaban (ELIQUIS) 5 MG TABS tablet Take by mouth. Patient not taking: Reported on 03/22/2022 11/17/17   [provider]  dronedarone (MULTAQ) 400 MG tablet Take by mouth 2 (two) times daily. Patient not taking: Reported on 03/22/2022 10/14/17   [provider]  EPINEPHrine 0.3 mg/0.3 mL IJ SOAJ injection as needed. 06/11/18   [provider]  famotidine (PEPCID) 20 MG tablet Take by mouth as needed. Patient not taking: Reported on 03/22/2022 06/11/18   [provider]  Magnesium Amino Acid Chelate 20 % POWD daily. Patient not taking: Reported on 03/22/2022    [provider]  nicotine (NICOTROL) 10 MG inhaler Begin 4 weeks before your Quit Smoking Day (10 cartridges per day)  Inhale multiple small puffs into mouth to control urges Patient not taking: Reported on 03/22/2022 10/12/17   [provider]  polyethylene glycol powder (GLYCOLAX/MIRALAX) 17 GM/SCOOP powder Take by mouth.    [provider]  tiotropium (SPIRIVA HANDIHALER) 18 MCG inhalation capsule as needed. Patient not taking: Reported on 03/22/2022 10/15/17   [provider]  traZODone (DESYREL) 100 MG tablet Take by mouth as needed. Patient not taking: Reported on 03/22/2022 07/30/18 09/04/18  [provider]  triamcinolone ointment (KENALOG) 0.1 % as needed. 11/20/17   [provider]     Critical care provider statement:   Total critical care time: 33 minutes   Performed by: Lanney Gins MD   Critical care time was exclusive of separately billable procedures and treating other patients.   Critical care was necessary to treat or prevent imminent or life-threatening deterioration.   Critical care was time spent personally by me on the following activities: development of treatment plan with patient and/or surrogate as well as nursing, discussions with consultants, evaluation of patient's response to treatment, examination of  patient, obtaining history from patient or surrogate, ordering and performing treatments and interventions, ordering and review of laboratory studies, ordering and review of radiographic studies, pulse oximetry and re-evaluation of patient's condition.    Ottie Glazier, M.D.  Pulmonary & Critical Care Medicine

## 2022-03-23 NOTE — Evaluation (Signed)
Clinical/Bedside Swallow Evaluation Patient Details  Name: Richard Adams MRN: [id number] Date of Birth: June 29, 1951  Today's Date: 03/23/2022 Time: SLP Start Time (ACUTE ONLY): 0737 SLP Stop Time (ACUTE ONLY): 0945 SLP Time Calculation (min) (ACUTE ONLY): 20 min  Past Medical History:  Past Medical History:  Diagnosis Date   Alcohol dependence with intoxication ([place])    Alcohol induced insomnia ([place])    Atrial fibrillation with RVR (HCC)    Chronic bronchitis (HCC)    COPD (chronic obstructive pulmonary disease) ([place])    Diabetes mellitus without complication ([place])    ED (erectile dysfunction)    GERD (gastroesophageal reflux disease)    Headache    History of subdural hematoma    Hypertension    Migraine    Mixed hyperlipidemia    PAF (paroxysmal atrial fibrillation) ([place])    Screening for malignant neoplasm of respiratory organ    SOB (shortness of breath)    Tobacco use disorder    Type 2 diabetes mellitus with peripheral neuropathy (HCC)    Vertigo    Past Surgical History:  Past Surgical History:  Procedure Laterality Date   COLONOSCOPY     ESOPHAGOGASTRODUODENOSCOPY (EGD) WITH PROPOFOL N/A 12/14/2019   Procedure: ESOPHAGOGASTRODUODENOSCOPY (EGD) WITH PROPOFOL;  Surgeon: [place], Benay Pike, MD;  Location: [facility] ENDOSCOPY;  Service: Gastroenterology;  Laterality: N/A;   FLEXIBLE BRONCHOSCOPY W/ UPPER ENDOSCOPY     HPI:  Per H&P "Richard Adams is a 71 y.o. 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  Patient completed neoadjuvant chemoradiation therapy in June, 2021.  He was diagnosed with locally recurrent disease in March, 2022 and has pulmonary nodules suspicious for metastatic disease.  He is currently on single agent  therapy with pembrolizumab which he gets every 3 weeks.  Patient has worsening dysphagia and according to the wife he is only able to tolerate liquid diet at this time.  Patient's wife also states that he was recently treated for aspiration pneumonia with Augmentin and azithromycin and has completed antibiotic therapy.  Patient's wife called EMS earlier this morning because of worsening shortness of breath.  Per EMS patient had room air pulse oximetry of 82% and was placed on CPAP with improvement in his pulse oximetry to 93%.  He was noted to be in rapid A-fib with heart rate between 89 and 171.  He has a cough productive of beige phlegm but denies having any fever or chills.  He has no nausea, no vomiting, no dizziness, no lightheadedness, no leg swelling, no headache, no blurred vision no focal deficit.  Labs show an initial lactate of 4.5 which shows a downward trend as well as marked leukocytosis.  Chest x-ray shows bilateral nodular opacities which may reflect underlying malignancy as well as diffuse reticulonodular opacities throughout the right lung and left lower lobe which may reflect superimposed inflammation or infection.  Patient received aggressive IV fluid resuscitation and antibiotic therapy and is currently on a Cardizem drip.  He will be admitted to the hospital for further evaluation."    Assessment / Plan / Recommendation  Clinical Impression  Pt seen for clinical swallowing evaluation. Pt alert and cooperative. Pt on 2L/min O2 via [place].   Endorsed a hx of chronic dysphagia ("for years") and noted consuming a liquid diet PTA. Per chart review, pt's  dysphagia noted at least as far back as 2020. Noted with hx of SCC of the midesophagus s/p CXR.   Per chart review, temp WNL and WBC elevated. CXR, 03/22/22, "1. Bilateral nodular opacities identified within the right lung and left lower lobe. These are indeterminate. Findings may reflect areas of multifocal pneumonia. Underlying malignancy would  be difficult to exclude in this patient who has a history esophageal carcinoma. Consider more definitive characterization with CT of the chest. 2. Diffuse reticulonodular opacities are noted throughout the right lung and left lower lobe which may reflect superimposed inflammation or infection."  Pt given trials of ice chips, thin liquids (via tsp, cup, and straw), and pureed. Oral phase was functional across trials. Concern for highly suspected pharyngeal dysphagia given multiple swallows and seemingly reduced laryngeal elevation to palpation with water and pureed, inconsistent immediate/prolonged cough response with thin liquids regardless of presentation, and inconsistent delayed cough with pureed. Pt with increased HR (in the 150s) after prolonged coughing spell. Additionally, pt grimacing with trials of puree; however, pt did not express odynophagia. Suspect likely esophageal component to pt's dysphagia.  At present, a safe oral diet cannot bee recommended. Recommend NPO x ice chips in moderation for comfort. Pt eager to resume PO intake and seems well-versed in risks/sequelae of aspiration/aspiration PNA. Pt would benefit from Palliative Care consult help establish GOC.   SLP to f/u as appropriate pending further establishment of [place].   Pt, RN, and MD made aware of results, recommendations, and SLP POC. Pt verbalized understanding and expressed appreciation for SLP evaluation.   SLP Visit Diagnosis: Dysphagia, pharyngeal phase (R13.13);Dysphagia, pharyngoesophageal phase (R13.14)    Aspiration Risk  Moderate aspiration risk;Severe aspiration risk;Risk for inadequate nutrition/hydration    Diet Recommendation NPO;Ice chips PRN after oral care        Other  Recommendations Recommended Consults:  (Palliative Care Consult) Oral Care Recommendations: Oral care QID;Patient independent with oral care    Recommendations for follow up therapy are one component of a multi-disciplinary discharge  planning process, led by the attending physician.  Recommendations may be updated based on patient status, additional functional criteria and insurance authorization.  Follow up Recommendations Follow physician's recommendations for discharge plan and follow up therapies      Assistance Recommended at Discharge  (likely need for assistance at d/c given deconditioning)  Functional Status Assessment Patient has not had a recent decline in their functional status (deficits seem baseline)         Prognosis Prognosis for Safe Diet Advancement: Guarded Barriers to Reach Goals: Severity of deficits;Time post onset      Swallow Study   General Date of Onset: 03/22/22 HPI: Per H&P "Richard Adams is a 71 y.o. 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  Patient completed neoadjuvant chemoradiation therapy in June, 2021.  He was diagnosed with locally recurrent disease in March, 2022 and has pulmonary nodules suspicious for metastatic disease.  He is currently on single agent therapy with pembrolizumab which he gets every 3 weeks.  Patient has worsening dysphagia and according to the wife he is only able to tolerate liquid diet at this time.  Patient's wife also states that he was recently treated for aspiration pneumonia with Augmentin and azithromycin and has completed antibiotic therapy.  Patient's wife called EMS  earlier this morning because of worsening shortness of breath.  Per EMS patient had room air pulse oximetry of 82% and was placed on CPAP with improvement in his pulse oximetry to 93%.  He was noted to be in rapid A-fib with heart rate between 89 and 171.  He has a cough productive of beige phlegm but denies having any fever or chills.  He  has no nausea, no vomiting, no dizziness, no lightheadedness, no leg swelling, no headache, no blurred vision no focal deficit.  Labs show an initial lactate of 4.5 which shows a downward trend as well as marked leukocytosis.  Chest x-ray shows bilateral nodular opacities which may reflect underlying malignancy as well as diffuse reticulonodular opacities throughout the right lung and left lower lobe which may reflect superimposed inflammation or infection.  Patient received aggressive IV fluid resuscitation and antibiotic therapy and is currently on a Cardizem drip.  He will be admitted to the hospital for further evaluation." Type of Study: Bedside Swallow Evaluation Previous Swallow Assessment: documented hx of dysphagia Diet Prior to this Study: NPO Temperature Spikes Noted: No (WBC 21.9) Respiratory Status: Nasal cannula History of Recent Intubation: No Behavior/Cognition: Alert;Cooperative Oral Cavity Assessment: Within Functional Limits Oral Care Completed by SLP: Recent completion by staff Oral Cavity - Dentition: Missing dentition Vision: Functional for self-feeding Self-Feeding Abilities: Able to feed self Patient Positioning: Upright in bed Baseline Vocal Quality: Richard Volitional Cough: Strong Volitional Swallow: Able to elicit    Oral/Motor/Sensory Function Overall Oral Motor/Sensory Function: Within functional limits   Ice Chips Ice chips: Within functional limits Presentation: Self Fed;Spoon   Thin Liquid Thin Liquid: Impaired Presentation: Spoon;Cup;Straw Pharyngeal  Phase Impairments: Decreased hyoid-laryngeal movement;Multiple swallows;Cough - Immediate;Change in Vital Signs Other Comments: increased HR into 150s following coughing spell; overt s/sx noted intermittently across presentations    Nectar Thick Nectar Thick Liquid: Not tested   Honey Thick Honey Thick Liquid: Not tested   Puree Puree: Impaired Presentation: Spoon Pharyngeal Phase Impairments: Decreased  hyoid-laryngeal movement;Multiple swallows;Cough - Delayed Other Comments: grimacing; did not endorse odynophagia   Solid     Solid: Not tested     Cherrie Gauze, M.S., [facility] [phone] [place])  Quintella Baton 03/23/2022,12:24 PM

## 2022-03-23 NOTE — Progress Notes (Signed)
PT refusing Bipap, demanding Simla.   [facility] Informed.

## 2022-03-23 NOTE — Progress Notes (Signed)
Patient continues moving Bipap mask, offloading the pressure, pulling mask away. Education was provided regarding the importance of compliance with BiPap.  Patient verbalizes understanding.

## 2022-03-23 NOTE — Progress Notes (Addendum)
Progress Note    Richard Adams  GEX:[id number] DOB: 1950/08/26  DOA: 03/22/2022 PCP: [name], PA-C      Brief Narrative:    Medical records reviewed and are as summarized below:  Richard Adams is a 71 y.o. 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  Patient completed neoadjuvant chemoradiation therapy in June, 2021.  He was diagnosed with locally recurrent disease in March, 2022 and has pulmonary nodules suspicious for metastatic disease.  He is currently on single agent therapy with pembrolizumab which he gets every 3 weeks. Patient has worsening dysphagia and according to the wife he is only able to tolerate liquid diet at this time. Patient's wife also states that he was recently treated for aspiration pneumonia with Augmentin and azithromycin and has completed antibiotic therapy.  He presented to the hospital because of cough, worsening shortness of breath and oxygen desaturation 82%.  He was placed on CPAP by EMS.  He was also noted to be atrial fibrillation with RVR.  Lactic acid was 4.5 on admission.  Chest x-ray showed bilateral opacities.  He was admitted to the hospital for atrial fibrillation with RVR and severe sepsis secondary to aspiration pneumonia.     Assessment/Plan:   Principal Problem:   Severe sepsis (HCC) Active Problems:   Aspiration pneumonia (HCC)   Acute respiratory failure with hypoxia (HCC)   Atrial fibrillation with RVR (HCC)   COPD (chronic obstructive pulmonary disease) (HCC)   Tobacco use disorder   Diabetes mellitus without complication (HCC)   Chronic back pain   Pressure injury of skin    Body mass index is 16.81 kg/m.  (Underweight)  Severe sepsis secondary to aspiration  pneumonia: Continue empiric IV Unasyn..  Follow-up blood cultures.  Atrial fibrillation with RVR: Continue IV Cardizem infusion and taper off drip as able.  Acute hypoxic respiratory failure: Initially required BiPAP.  He is now on 2 L/min oxygen.  Hypokalemia, hypophosphatemia: Replete potassium intravenously.  Magnesium is low normal.  Replete with IV magnesium sulfate.  Monitor phosphorus level and replete phosphorus if it continues to drop.  Dysphagia: Speech therapist recommended n.p.o. Okay to have ice chips.  Unfortunately, patient is eager to eat.  Restart IV fluids for hydration.  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  He does not think he has to be hospitalized.  Diagnoses and severity of his illness was explained to him in detail.  He was told that he is not medically stable for discharge at this time.  He said he was willing to leave [place].  He understands that he is at increased risk for decompensation with worsening respiratory failure, septic shock, cardiopulmonary arrest and death.  Plan of care was discussed with his wife.  His wife does not want him to leave AMA.  She is hoping that patient will be stable enough so that he can go to [facility] where he receives regular care from the oncology group.  Consult palliative care to discuss goals of care  Diet Order  Diet NPO time specified Except for: Ice Chips  Diet effective now                            Consultants: Intensivist  Procedures: None    Medications:    budesonide (PULMICORT) nebulizer solution  0.25 mg Nebulization BID   Chlorhexidine Gluconate Cloth  6 each Topical Daily    enoxaparin (LOVENOX) injection  40 mg Subcutaneous Q24H   insulin aspart  0-15 Units Subcutaneous Q4H   ipratropium-albuterol  3 mL Nebulization Q6H   magic mouthwash w/lidocaine  15 mL Oral QID   methylPREDNISolone (SOLU-MEDROL) injection  80 mg Intravenous Q24H   nicotine  7 mg Transdermal Daily   pantoprazole (PROTONIX) IV  40 mg Intravenous Q24H   Continuous Infusions:  ampicillin-sulbactam (UNASYN) IV Stopped (03/23/22 0546)   diltiazem (CARDIZEM) infusion 10 mg/hr (03/23/22 1149)   lactated ringers       Anti-infectives (From admission, onward)    Start     Dose/Rate Route Frequency Ordered Stop   03/22/22 1200  Ampicillin-Sulbactam (UNASYN) 3 g in sodium chloride 0.9 % 100 mL IVPB        3 g 200 mL/hr over 30 Minutes Intravenous Every 6 hours 03/22/22 1150     03/22/22 0745  ceFEPIme (MAXIPIME) 2 g in sodium chloride 0.9 % 100 mL IVPB        2 g 200 mL/hr over 30 Minutes Intravenous  Once 03/22/22 0742 03/22/22 0816   03/22/22 0745  metroNIDAZOLE (FLAGYL) IVPB 500 mg        500 mg 100 mL/hr over 60 Minutes Intravenous  Once 03/22/22 0742 03/22/22 1009   03/22/22 0745  vancomycin (VANCOCIN) IVPB 1000 mg/200 mL premix        1,000 mg 200 mL/hr over 60 Minutes Intravenous  Once 03/22/22 2831 03/22/22 1011              Family Communication/Anticipated D/C date and plan/Code Status   DVT prophylaxis: enoxaparin (LOVENOX) injection 40 mg Start: 03/22/22 2200     Code Status: Full Code  Family Communication: Plan discussed with his wife Disposition Plan: Plan to discharge home in 2 to 3 days   Status is: Inpatient Remains inpatient appropriate because: On IV antibiotics for sepsis from pneumonia, on IV Cardizem drip for A-fib with RVR       Subjective:   Interval events noted.  He does not report any shortness of breath or chest pain.  However, he was coughing intermittently during this encounter.  He said he wants to be discharged home because he does  not feel he needs to be hospitalized.  Objective:    Vitals:   03/23/22 1217 03/23/22 1300 03/23/22 1330 03/23/22 1400  BP: 120/83 118/83 117/88 120/81  Pulse: (!) 102 (!) 117 (!) 116 (!) 119  Resp: (!) 26 20 (!) 28 (!) 28  Temp:      TempSrc:      SpO2: 96% 94% 94% 96%  Weight:      Height:       No data found.   Intake/Output Summary (Last 24 hours) at 03/23/2022 1451 Last data filed at 03/23/2022 0900 Gross per 24 hour  Intake 621.51 ml  Output 800 ml  Net -178.49 ml   Filed Weights   03/22/22 0655  Weight: 57.8 kg    Exam:  GEN: NAD SKIN: Warm and dry, stage II decubitus ulcers on bilateral buttocks EYES: No  pallor or icterus ENT: MMM CV: Irregular rate and rhythm, tachycardic PULM: Bibasilar rales, no wheezing ABD: soft, ND, NT, +BS CNS: AAO x 3, non focal EXT: No edema or tenderness       Data Reviewed:   I have personally reviewed following labs and imaging studies:  Labs: Labs show the following:   Basic Metabolic Panel: Recent Labs  Lab 03/22/22 0651 03/23/22 0420  NA 133* 137  K 3.5 3.4*  CL 100 107  CO2 19* 23  GLUCOSE 258* 106*  BUN 22 22  CREATININE 0.79 0.62  CALCIUM 9.9 9.0  MG 1.4* 1.7  PHOS  --  2.1*   GFR Estimated Creatinine Clearance: 70.2 mL/min (by C-G formula based on SCr of 0.62 mg/dL). Liver Function Tests: Recent Labs  Lab 03/22/22 0651  AST 57*  ALT 68*  ALKPHOS 55  BILITOT 0.6  PROT 8.7*  ALBUMIN 3.4*   No results for input(s): "LIPASE", "AMYLASE" in the last 168 hours. No results for input(s): "AMMONIA" in the last 168 hours. Coagulation profile Recent Labs  Lab 03/22/22 0729 03/23/22 0420  INR 1.1 1.5*    CBC: Recent Labs  Lab 03/22/22 0651 03/23/22 0420  WBC 18.5* 21.9*  NEUTROABS 17.2*  --   HGB 12.5* 9.1*  HCT 40.1 28.1*  MCV 87.6 84.9  PLT 594* 384   Cardiac Enzymes: No results for input(s): "CKTOTAL", "CKMB", "CKMBINDEX", "TROPONINI" in the last 168 hours. BNP (last 3 results) No  results for input(s): "PROBNP" in the last 8760 hours. CBG: Recent Labs  Lab 03/22/22 1912 03/22/22 2320 03/23/22 0359 03/23/22 0737 03/23/22 1155  GLUCAP 314* 141* 106* 134* 178*   D-Dimer: No results for input(s): "DDIMER" in the last 72 hours. Hgb A1c: Recent Labs    03/22/22 1801  HGBA1C 5.7*   Lipid Profile: No results for input(s): "CHOL", "HDL", "LDLCALC", "TRIG", "CHOLHDL", "LDLDIRECT" in the last 72 hours. Thyroid function studies: Recent Labs    03/22/22 0858  TSH 0.374   Anemia work up: No results for input(s): "VITAMINB12", "FOLATE", "FERRITIN", "TIBC", "IRON", "RETICCTPCT" in the last 72 hours. Sepsis Labs: Recent Labs  Lab 03/22/22 8127 03/22/22 0651 03/22/22 0858 03/23/22 0420 03/23/22 1130  PROCALCITON  --  <0.10  --  3.91  --   WBC  --  18.5*  --  21.9*  --   LATICACIDVEN 4.5*  --  2.1*  --  1.3    Microbiology Recent Results (from the past 240 hour(s))  Blood Culture (routine x 2)     Status: None (Preliminary result)   Collection Time: 03/22/22  6:51 AM   Specimen: BLOOD  Result Value Ref Range Status   Specimen Description BLOOD BLOOD LEFT FOREARM  Final   Special Requests   Final    BOTTLES DRAWN AEROBIC AND ANAEROBIC Blood Culture results may not be optimal due to an inadequate volume of blood received in culture bottles   Culture   Final    NO GROWTH 1 DAY Performed at [facility], [address] [place], [place] [address]    Report Status PENDING  Incomplete  Blood Culture (routine x 2)     Status: None (Preliminary result)   Collection Time: 03/22/22  6:52 AM   Specimen: BLOOD  Result Value Ref Range Status   Specimen Description BLOOD BLOOD RIGHT HAND  Final   Special Requests   Final    BOTTLES DRAWN AEROBIC AND ANAEROBIC Blood Culture adequate volume   Culture  Final    NO GROWTH 1 DAY Performed at [facility], [address] [place], [place] [address]    Report Status PENDING  Incomplete  SARS  Coronavirus 2 by RT PCR (hospital order, performed in [facility] hospital lab) *cepheid single result test* Anterior Nasal Swab     Status: None   Collection Time: 03/22/22  6:54 AM   Specimen: Anterior Nasal Swab  Result Value Ref Range Status   SARS Coronavirus 2 by RT PCR NEGATIVE NEGATIVE Final    Comment: (NOTE) SARS-CoV-2 target nucleic acids are NOT DETECTED.  The SARS-CoV-2 RNA is generally detectable in upper and lower respiratory specimens during the acute phase of infection. The lowest concentration of SARS-CoV-2 viral copies this assay can detect is 250 copies / mL. A negative result does not preclude SARS-CoV-2 infection and should not be used as the sole basis for treatment or other patient management decisions.  A negative result may occur with improper specimen collection / handling, submission of specimen other than nasopharyngeal swab, presence of viral mutation(s) within the areas targeted by this assay, and inadequate number of viral copies (<250 copies / mL). A negative result must be combined with clinical observations, patient history, and epidemiological information.  Fact Sheet for Patients:   https://www.patel.info/  Fact Sheet for Healthcare Providers: https://hall.com/  This test is not yet approved or  cleared by the [place] FDA and has been authorized for detection and/or diagnosis of SARS-CoV-2 by FDA under an Emergency Use Authorization (EUA).  This EUA will remain in effect (meaning this test can be used) for the duration of the COVID-19 declaration under Section 564(b)(1) of the Act, 21 U.S.C. section 360bbb-3(b)(1), unless the authorization is terminated or revoked sooner.  Performed at [facility], [place], [place] [address]   Respiratory (~20 pathogens) panel by PCR     Status: None   Collection Time: 03/22/22  5:35 PM   Specimen: Nasopharyngeal Swab; Respiratory   Result Value Ref Range Status   Adenovirus NOT DETECTED NOT DETECTED Final   Coronavirus 229E NOT DETECTED NOT DETECTED Final    Comment: (NOTE) The Coronavirus on the Respiratory Panel, DOES NOT test for the novel  Coronavirus (2019 nCoV)    Coronavirus HKU1 NOT DETECTED NOT DETECTED Final   Coronavirus NL63 NOT DETECTED NOT DETECTED Final   Coronavirus OC43 NOT DETECTED NOT DETECTED Final   Metapneumovirus NOT DETECTED NOT DETECTED Final   Rhinovirus / Enterovirus NOT DETECTED NOT DETECTED Final   Influenza A NOT DETECTED NOT DETECTED Final   Influenza B NOT DETECTED NOT DETECTED Final   Parainfluenza Virus 1 NOT DETECTED NOT DETECTED Final   Parainfluenza Virus 2 NOT DETECTED NOT DETECTED Final   Parainfluenza Virus 3 NOT DETECTED NOT DETECTED Final   Parainfluenza Virus 4 NOT DETECTED NOT DETECTED Final   Respiratory Syncytial Virus NOT DETECTED NOT DETECTED Final   Bordetella pertussis NOT DETECTED NOT DETECTED Final   Bordetella Parapertussis NOT DETECTED NOT DETECTED Final   Chlamydophila pneumoniae NOT DETECTED NOT DETECTED Final   Mycoplasma pneumoniae NOT DETECTED NOT DETECTED Final    Comment: Performed at [facility], [place]. [address], [place], [place] [address]  MRSA Next Gen by PCR, Nasal     Status: None   Collection Time: 03/22/22  8:36 PM   Specimen: Nasal Mucosa; Nasal Swab  Result Value Ref Range Status   MRSA by PCR Next Gen NOT DETECTED NOT DETECTED Final  Comment: (NOTE) The GeneXpert MRSA Assay (FDA approved for NASAL specimens only), is one component of a comprehensive MRSA colonization surveillance program. It is not intended to diagnose MRSA infection nor to guide or monitor treatment for MRSA infections. Test performance is not FDA approved in patients less than 84 years old. Performed at [facility], [address], [place], [place] [address]     Procedures and diagnostic studies:  DG Chest [facility] 1 View  Result Date:  03/22/2022 CLINICAL DATA:  Concern for sepsis. Aspiration pneumonia. History of esophageal carcinoma. EXAM: PORTABLE CHEST 1 VIEW COMPARISON:  None Available. FINDINGS: Heart size appears normal. No pleural effusion identified. Pleuroparenchymal scarring identified within the lateral right lung base. Signs of bilateral lower lobe bronchiectasis. Reticular and nodular opacities are identified throughout the right lung and left lower lobe. Bilateral nodular opacities are identified within the periphery of the right upper lobe, left and left lower lobe. Within the right base there is a focal irregular opacity with surrounding architectural distortion. The visualized osseous structures appear unremarkable. IMPRESSION: 1. Bilateral nodular opacities identified within the right lung and left lower lobe. These are indeterminate. Findings may reflect areas of multifocal pneumonia. Underlying malignancy would be difficult to exclude in this patient who has a history esophageal carcinoma. Consider more definitive characterization with CT of the chest. 2. Diffuse reticulonodular opacities are noted throughout the right lung and left lower lobe which may reflect superimposed inflammation or infection. Electronically Signed   By: Kerby Moors M.D.   On: 03/22/2022 07:33               LOS: 1 day   Emmelyn Schmale  [facility]   Pager on [email or web address]. If 7PM-7AM, please contact night-coverage at [email or web address]     03/23/2022, 2:51 PM

## 2022-03-24 ENCOUNTER — Encounter: Payer: Self-pay | Admitting: Internal Medicine

## 2022-03-24 DIAGNOSIS — I4891 Unspecified atrial fibrillation: Secondary | ICD-10-CM | POA: Diagnosis not present

## 2022-03-24 DIAGNOSIS — A419 Sepsis, unspecified organism: Secondary | ICD-10-CM | POA: Diagnosis not present

## 2022-03-24 DIAGNOSIS — Z515 Encounter for palliative care: Secondary | ICD-10-CM

## 2022-03-24 DIAGNOSIS — Z7189 Other specified counseling: Secondary | ICD-10-CM

## 2022-03-24 DIAGNOSIS — J690 Pneumonitis due to inhalation of food and vomit: Secondary | ICD-10-CM | POA: Diagnosis not present

## 2022-03-24 DIAGNOSIS — R6520 Severe sepsis without septic shock: Secondary | ICD-10-CM | POA: Diagnosis not present

## 2022-03-24 DIAGNOSIS — J9601 Acute respiratory failure with hypoxia: Secondary | ICD-10-CM | POA: Diagnosis not present

## 2022-03-24 LAB — GLUCOSE, CAPILLARY
Glucose-Capillary: 149 mg/dL — ABNORMAL HIGH (ref 70–99)
Glucose-Capillary: 153 mg/dL — ABNORMAL HIGH (ref 70–99)
Glucose-Capillary: 155 mg/dL — ABNORMAL HIGH (ref 70–99)
Glucose-Capillary: 168 mg/dL — ABNORMAL HIGH (ref 70–99)
Glucose-Capillary: 180 mg/dL — ABNORMAL HIGH (ref 70–99)
Glucose-Capillary: 190 mg/dL — ABNORMAL HIGH (ref 70–99)

## 2022-03-24 LAB — CBC WITH DIFFERENTIAL/PLATELET
Abs Immature Granulocytes: 0.18 10*3/uL — ABNORMAL HIGH (ref 0.00–0.07)
Basophils Absolute: 0 10*3/uL (ref 0.0–0.1)
Basophils Relative: 0 %
Eosinophils Absolute: 0 10*3/uL (ref 0.0–0.5)
Eosinophils Relative: 0 %
HCT: 27.8 % — ABNORMAL LOW (ref 39.0–52.0)
Hemoglobin: 9.1 g/dL — ABNORMAL LOW (ref 13.0–17.0)
Immature Granulocytes: 1 %
Lymphocytes Relative: 1 %
Lymphs Abs: 0.2 10*3/uL — ABNORMAL LOW (ref 0.7–4.0)
MCH: 28.1 pg (ref 26.0–34.0)
MCHC: 32.7 g/dL (ref 30.0–36.0)
MCV: 85.8 fL (ref 80.0–100.0)
Monocytes Absolute: 0.2 10*3/uL (ref 0.1–1.0)
Monocytes Relative: 1 %
Neutro Abs: 18 10*3/uL — ABNORMAL HIGH (ref 1.7–7.7)
Neutrophils Relative %: 97 %
Platelets: 346 10*3/uL (ref 150–400)
RBC: 3.24 MIL/uL — ABNORMAL LOW (ref 4.22–5.81)
RDW: 17.6 % — ABNORMAL HIGH (ref 11.5–15.5)
WBC: 18.6 10*3/uL — ABNORMAL HIGH (ref 4.0–10.5)
nRBC: 0 % (ref 0.0–0.2)

## 2022-03-24 LAB — BASIC METABOLIC PANEL
Anion gap: 8 (ref 5–15)
BUN: 23 mg/dL (ref 8–23)
CO2: 19 mmol/L — ABNORMAL LOW (ref 22–32)
Calcium: 9.1 mg/dL (ref 8.9–10.3)
Chloride: 106 mmol/L (ref 98–111)
Creatinine, Ser: 0.66 mg/dL (ref 0.61–1.24)
GFR, Estimated: >60 mL/min (ref >60)
Glucose, Bld: 193 mg/dL — ABNORMAL HIGH (ref 70–99)
Potassium: 4.2 mmol/L (ref 3.5–5.1)
Sodium: 133 mmol/L — ABNORMAL LOW (ref 135–145)

## 2022-03-24 LAB — PHOSPHORUS: Phosphorus: 2.3 mg/dL — ABNORMAL LOW (ref 2.5–4.6)

## 2022-03-24 LAB — PROCALCITONIN: Procalcitonin: 2.2 ng/mL

## 2022-03-24 LAB — MAGNESIUM: Magnesium: 1.8 mg/dL (ref 1.7–2.4)

## 2022-03-24 MED ORDER — POTASSIUM CHLORIDE IN NACL 20-0.45 MEQ/L-% IV SOLN
INTRAVENOUS | Status: DC
Start: 2022-03-24 — End: 2022-03-25
  Filled 2022-03-24 (×4): qty 1000

## 2022-03-24 MED ORDER — MAGNESIUM SULFATE 2 GM/50ML IV SOLN
2.0000 g | Freq: Once | INTRAVENOUS | Status: AC
  Administered 2022-03-24: 2 g via INTRAVENOUS
  Filled 2022-03-24: qty 50

## 2022-03-24 MED ORDER — LORAZEPAM 2 MG/ML IJ SOLN
1.0000 mg | INTRAMUSCULAR | Status: DC | PRN
Start: 2022-03-24 — End: 2022-03-25
  Administered 2022-03-24 – 2022-03-25 (×3): 1 mg via INTRAVENOUS
  Filled 2022-03-24 (×3): qty 1

## 2022-03-24 MED ORDER — IPRATROPIUM-ALBUTEROL 0.5-2.5 (3) MG/3ML IN SOLN
3.0000 mL | Freq: Three times a day (TID) | RESPIRATORY_TRACT | Status: DC
Start: 2022-03-25 — End: 2022-03-25
  Administered 2022-03-25: 3 mL via RESPIRATORY_TRACT
  Filled 2022-03-24: qty 3

## 2022-03-24 MED ORDER — SODIUM CHLORIDE 0.9 % IV SOLN: INTRAVENOUS | Status: DC | PRN

## 2022-03-24 NOTE — Progress Notes (Addendum)
Progress Note    Richard Adams  JEH:[id number] DOB: Oct 10, 1950  DOA: 03/22/2022 PCP: [name], PA-C      Brief Narrative:    Medical records reviewed and are as summarized below:  Richard Adams is a 71 y.o. 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  Patient completed neoadjuvant chemoradiation therapy in June, 2021.  He was diagnosed with locally recurrent disease in March, 2022 and has pulmonary nodules suspicious for metastatic disease.  He is currently on single agent therapy with pembrolizumab which he gets every 3 weeks. Patient has worsening dysphagia and according to the wife he is only able to tolerate liquid diet at this time. Patient's wife also states that he was recently treated for aspiration pneumonia with Augmentin and azithromycin and has completed antibiotic therapy.  He presented to the hospital because of cough, worsening shortness of breath and oxygen desaturation 82%.  He was placed on CPAP by EMS.  He was also noted to be atrial fibrillation with RVR.  Lactic acid was 4.5 on admission.  Chest x-ray showed bilateral opacities.  He was admitted to the hospital for atrial fibrillation with RVR and severe sepsis secondary to aspiration pneumonia.     Assessment/Plan:   Principal Problem:   Severe sepsis (HCC) Active Problems:   Aspiration pneumonia (HCC)   Acute respiratory failure with hypoxia (HCC)   Atrial fibrillation with RVR (HCC)   COPD (chronic obstructive pulmonary disease) (HCC)   Tobacco use disorder   Diabetes mellitus without complication (HCC)   Chronic back pain   Pressure injury of skin    Body mass index is 16.81 kg/m.  (Underweight)  Severe sepsis secondary to aspiration  pneumonia: Continue empiric IV Unasyn.  He will need 5 to 7 days of IV antibiotics since it is unsafe for him to swallow at this point.    Atrial fibrillation with RVR: Continue IV Cardizem infusion and wean off Cardizem drip as able.  Acute hypoxic respiratory failure: Initially required BiPAP.  Continue 3 L/min oxygen via nasal cannula and wean off as able  Hyponatremia, hypokalemia, hypophosphatemia: Potassium has normalized.  Phosphorus level is trending upward (improving).  Continue IV fluids.  Monitor phosphorus level.  Magnesium is 1.8.  Give IV magnesium sulfate to keep magnesium at 2 or greater.  Dysphagia: Speech therapist recommended NPO.  Okay to have ice chips.  Discussed feeding tube as a potential option for medications and nutrition though it will not completely eliminate the risk of aspiration.  Patient is not interested in feeding tube at this point.  Continue IV fluids for hydration.  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 Sheleigh, RN, was present during this discussion.  Diagnoses and management were discussed.  Prognosis was discussed as well.  Patient is not ready to transition to hospice or comfort care at this time.  He prefers to be discharged home today.  Unfortunately, he is not medically ready for discharge.  He has been told that leaving [place] is an  option.  However, I advised against leaving AMA since he is at very high risk for decompensation including but not limited to cardiopulmonary arrest, worsening infection, respiratory failure and death.     Consult palliative care to discuss goals of care  Diet Order             Diet NPO  time specified Except for: Ice Chips  Diet effective now                            Consultants: Intensivist  Procedures: None    Medications:    budesonide (PULMICORT) nebulizer solution  0.25 mg Nebulization BID   Chlorhexidine Gluconate Cloth  6 each Topical Daily   enoxaparin (LOVENOX) injection  40 mg Subcutaneous Q24H   insulin aspart  0-15 Units Subcutaneous Q4H   ipratropium-albuterol  3 mL Nebulization Q6H   LORazepam  0.25 mg Intravenous Once   magic mouthwash w/lidocaine  15 mL Oral QID   methylPREDNISolone (SOLU-MEDROL) injection  80 mg Intravenous Q24H   nicotine  7 mg Transdermal Daily   pantoprazole (PROTONIX) IV  40 mg Intravenous Q24H   Continuous Infusions:  0.45 % NaCl with KCl 20 mEq / L     ampicillin-sulbactam (UNASYN) IV 3 g (03/24/22 1001)   diltiazem (CARDIZEM) infusion 7.5 mg/hr (03/24/22 0900)     Anti-infectives (From admission, onward)    Start     Dose/Rate Route Frequency Ordered Stop   03/22/22 1200  Ampicillin-Sulbactam (UNASYN) 3 g in sodium chloride 0.9 % 100 mL IVPB        3 g 200 mL/hr over 30 Minutes Intravenous Every 6 hours 03/22/22 1150     03/22/22 0745  ceFEPIme (MAXIPIME) 2 g in sodium chloride 0.9 % 100 mL IVPB        2 g 200 mL/hr over 30 Minutes Intravenous  Once 03/22/22 0742 03/22/22 0816   03/22/22 0745  metroNIDAZOLE (FLAGYL) IVPB 500 mg        500 mg 100 mL/hr over 60 Minutes Intravenous  Once 03/22/22 0742 03/22/22 1009   03/22/22 0745  vancomycin (VANCOCIN) IVPB 1000 mg/200 mL premix        1,000 mg 200 mL/hr over 60 Minutes Intravenous  Once 03/22/22 2440 03/22/22 1011              Family Communication/Anticipated D/C date and plan/Code Status   DVT prophylaxis: enoxaparin (LOVENOX) injection 40 mg Start: 03/22/22 2200     Code Status: Full Code  Family Communication: Plan discussed with his wife Disposition Plan: Plan to discharge home in 2 to 3 days   Status is:  Inpatient Remains inpatient appropriate because: On IV antibiotics for sepsis from pneumonia, on IV Cardizem drip for A-fib with RVR       Subjective:   Interval events noted.  He said he feels better and wants to be discharged home.  No chest pain or shortness of breath.  He was heard coughing from time to time.  His wife was at the bedside.  Sheleigh, RN, his nurse, was also at the bedside.  Objective:    Vitals:   03/24/22 0700 03/24/22 0800 03/24/22 1220 03/24/22 1300  BP: 110/86 116/73 121/83 113/78  Pulse: 85 94 86   Resp: (!) 28 (!) 21 (!) 28 19  Temp:  98.4 F (36.9 C) 97.7 F (36.5 C)   TempSrc:  Oral Oral   SpO2: 96% 97% 99%  Weight:      Height:       No data found.   Intake/Output Summary (Last 24 hours) at 03/24/2022 1541 Last data filed at 03/24/2022 0900 Gross per 24 hour  Intake 1548.79 ml  Output 725 ml  Net 823.79 ml   Filed Weights   03/22/22 0655  Weight: 57.8 kg    Exam:  GEN: NAD SKIN: Warm and dry, stage II decubitus ulcers on bilateral buttocks EYES: No pallor or icterus ENT: MMM CV: Irregular rate and rhythm, tachycardic PULM: Bibasilar rales, no wheezing ABD: soft, ND, NT, +BS CNS: AAO x 3, non focal EXT: No edema or tenderness     Data Reviewed:   I have personally reviewed following labs and imaging studies:  Labs: Labs show the following:   Basic Metabolic Panel: Recent Labs  Lab 03/22/22 0651 03/23/22 0420 03/24/22 0353  NA 133* 137 133*  K 3.5 3.4* 4.2  CL 100 107 106  CO2 19* 23 19*  GLUCOSE 258* 106* 193*  BUN '22 22 23  '$ CREATININE 0.79 0.62 0.66  CALCIUM 9.9 9.0 9.1  MG 1.4* 1.7 1.8  PHOS  --  2.1* 2.3*   GFR Estimated Creatinine Clearance: 70.2 mL/min (by C-G formula based on SCr of 0.66 mg/dL). Liver Function Tests: Recent Labs  Lab 03/22/22 0651  AST 57*  ALT 68*  ALKPHOS 55  BILITOT 0.6  PROT 8.7*  ALBUMIN 3.4*   No results for input(s): "LIPASE", "AMYLASE" in the last 168 hours. No  results for input(s): "AMMONIA" in the last 168 hours. Coagulation profile Recent Labs  Lab 03/22/22 0729 03/23/22 0420  INR 1.1 1.5*    CBC: Recent Labs  Lab 03/22/22 0651 03/23/22 0420 03/24/22 0353  WBC 18.5* 21.9* 18.6*  NEUTROABS 17.2*  --  18.0*  HGB 12.5* 9.1* 9.1*  HCT 40.1 28.1* 27.8*  MCV 87.6 84.9 85.8  PLT 594* 384 346   Cardiac Enzymes: No results for input(s): "CKTOTAL", "CKMB", "CKMBINDEX", "TROPONINI" in the last 168 hours. BNP (last 3 results) No results for input(s): "PROBNP" in the last 8760 hours. CBG: Recent Labs  Lab 03/23/22 1557 03/23/22 2006 03/24/22 0252 03/24/22 0739 03/24/22 1136  GLUCAP 197* 154* 180* 168* 153*   D-Dimer: No results for input(s): "DDIMER" in the last 72 hours. Hgb A1c: Recent Labs    03/22/22 1801  HGBA1C 5.7*   Lipid Profile: No results for input(s): "CHOL", "HDL", "LDLCALC", "TRIG", "CHOLHDL", "LDLDIRECT" in the last 72 hours. Thyroid function studies: Recent Labs    03/22/22 0858  TSH 0.374   Anemia work up: No results for input(s): "VITAMINB12", "FOLATE", "FERRITIN", "TIBC", "IRON", "RETICCTPCT" in the last 72 hours. Sepsis Labs: Recent Labs  Lab 03/22/22 0649 03/22/22 0651 03/22/22 0858 03/23/22 0420 03/23/22 1130 03/24/22 0353  PROCALCITON  --  <0.10  --  3.91  --  2.20  WBC  --  18.5*  --  21.9*  --  18.6*  LATICACIDVEN 4.5*  --  2.1*  --  1.3  --     Microbiology Recent Results (from the past 240 hour(s))  Blood Culture (routine x 2)     Status: None (Preliminary result)   Collection Time: 03/22/22  6:51 AM   Specimen: BLOOD  Result Value Ref Range Status   Specimen Description BLOOD BLOOD LEFT FOREARM  Final   Special Requests   Final    BOTTLES DRAWN AEROBIC AND ANAEROBIC Blood Culture results may not be optimal due to an inadequate volume  of blood received in culture bottles   Culture   Final    NO GROWTH 2 DAYS Performed at [facility], [address] [place],  [place] [address]    Report Status PENDING  Incomplete  Blood Culture (routine x 2)     Status: None (Preliminary result)   Collection Time: 03/22/22  6:52 AM   Specimen: BLOOD  Result Value Ref Range Status   Specimen Description BLOOD BLOOD RIGHT HAND  Final   Special Requests   Final    BOTTLES DRAWN AEROBIC AND ANAEROBIC Blood Culture adequate volume   Culture   Final    NO GROWTH 2 DAYS Performed at [facility], [address], [place], [place] [address]    Report Status PENDING  Incomplete  SARS Coronavirus 2 by RT PCR (hospital order, performed in [facility] lab) *cepheid single result test* Anterior Nasal Swab     Status: None   Collection Time: 03/22/22  6:54 AM   Specimen: Anterior Nasal Swab  Result Value Ref Range Status   SARS Coronavirus 2 by RT PCR NEGATIVE NEGATIVE Final    Comment: (NOTE) SARS-CoV-2 target nucleic acids are NOT DETECTED.  The SARS-CoV-2 RNA is generally detectable in upper and lower respiratory specimens during the acute phase of infection. The lowest concentration of SARS-CoV-2 viral copies this assay can detect is 250 copies / mL. A negative result does not preclude SARS-CoV-2 infection and should not be used as the sole basis for treatment or other patient management decisions.  A negative result may occur with improper specimen collection / handling, submission of specimen other than nasopharyngeal swab, presence of viral mutation(s) within the areas targeted by this assay, and inadequate number of viral copies (<250 copies / mL). A negative result must be combined with clinical observations, patient history, and epidemiological information.  Fact Sheet for Patients:   https://www.patel.info/  Fact Sheet for Healthcare Providers: https://hall.com/  This test is not yet approved or  cleared by the [place] FDA and has been authorized for detection and/or diagnosis of SARS-CoV-2  by FDA under an Emergency Use Authorization (EUA).  This EUA will remain in effect (meaning this test can be used) for the duration of the COVID-19 declaration under Section 564(b)(1) of the Act, 21 U.S.C. section 360bbb-3(b)(1), unless the authorization is terminated or revoked sooner.  Performed at [facility], [place], [place] [address]   Respiratory (~20 pathogens) panel by PCR     Status: None   Collection Time: 03/22/22  5:35 PM   Specimen: Nasopharyngeal Swab; Respiratory  Result Value Ref Range Status   Adenovirus NOT DETECTED NOT DETECTED Final   Coronavirus 229E NOT DETECTED NOT DETECTED Final    Comment: (NOTE) The Coronavirus on the Respiratory Panel, DOES NOT test for the novel  Coronavirus (2019 nCoV)    Coronavirus HKU1 NOT DETECTED NOT DETECTED Final   Coronavirus NL63 NOT DETECTED NOT DETECTED Final   Coronavirus OC43 NOT DETECTED NOT DETECTED Final   Metapneumovirus NOT DETECTED NOT DETECTED Final   Rhinovirus / Enterovirus NOT DETECTED NOT DETECTED Final   Influenza A NOT DETECTED NOT DETECTED Final   Influenza B NOT DETECTED NOT DETECTED Final   Parainfluenza Virus 1 NOT DETECTED NOT DETECTED Final   Parainfluenza Virus 2 NOT DETECTED NOT DETECTED Final   Parainfluenza Virus 3 NOT DETECTED NOT DETECTED Final   Parainfluenza Virus 4 NOT DETECTED NOT DETECTED Final   Respiratory Syncytial Virus NOT  DETECTED NOT DETECTED Final   Bordetella pertussis NOT DETECTED NOT DETECTED Final   Bordetella Parapertussis NOT DETECTED NOT DETECTED Final   Chlamydophila pneumoniae NOT DETECTED NOT DETECTED Final   Mycoplasma pneumoniae NOT DETECTED NOT DETECTED Final    Comment: Performed at [facility], [place] [address], [place], [place] [address]  MRSA Next Gen by PCR, Nasal     Status: None   Collection Time: 03/22/22  8:36 PM   Specimen: Nasal Mucosa; Nasal Swab  Result Value Ref Range Status   MRSA by PCR Next Gen NOT DETECTED NOT  DETECTED Final    Comment: (NOTE) The GeneXpert MRSA Assay (FDA approved for NASAL specimens only), is one component of a comprehensive MRSA colonization surveillance program. It is not intended to diagnose MRSA infection nor to guide or monitor treatment for MRSA infections. Test performance is not FDA approved in patients less than 34 years old. Performed at [facility], [address] [place], [place] [address]     Procedures and diagnostic studies:  No results found.             LOS: 2 days   Rusty Glodowski  [facility]   Pager on [email or web address]. If 7PM-7AM, please contact night-coverage at [email or web address]     03/24/2022, 3:41 PM

## 2022-03-24 NOTE — Consult Note (Signed)
Consultation Note Date: 03/24/2022   Patient Name: Richard Adams  DOB: [birth year]  MRN: [id number]  Age / Sex: 71 y.o., male  PCP: [name], PA-C Referring Physician: Jennye Boroughs, MD  Reason for Consultation: Establishing goals of care  HPI/Patient Profile: 71 y.o. 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   Clinical Assessment and Goals of Care: I have reviewed medical records including EPIC notes, labs and imaging, received report from RN, assessed the patient.  Richard Adams is sitting up in bed.  He greets me, making and mostly keeping eye contact.  He is alert and oriented x 3, able to make his basic needs known.  His wife, Richard Adams, is present at bedside.  When asked, they are unclear about how long they have been married.  Richard Adams states they have been together for 30 to 35 years.  We then met at the bedside to discuss diagnosis prognosis, GOC, EOL wishes, disposition and options.  I introduced Palliative Medicine as specialized medical care for people living with serious illness. It focuses on providing relief from the symptoms and stress of a serious illness. The goal is to improve quality of life for both the patient and the family.  We focused on their current illness.  We did talk about the treatment plan including, but not limited to,  Cardizem infusion.  Jamond and Richard Adams states that Nate would like to leave.  They state that they would like to work with the staff to get prescriptions for needed medication and equipment.  They are working with his PCP through [facility] to meet these needs.  The natural disease trajectory and expectations at EOL were discussed.  Advanced directives, concepts specific to code status, artifical feeding and hydration, and rehospitalization were considered and discussed.  We talked about the concept of "treat the treatable, but allowing natural passing".  At this point continue full scope/full code  Hospice and Palliative Care services outpatient were explained and offered.  Mr. Sharp is active with outpatient palliative services for about 1 year through [facility].  We talked about the benefits of hospice care, but Richard Adams would like to continue taking Keytruda which they understand is not possible if he were to transition to hospice.  Discussed the importance of continued conversation with family and the medical providers regarding overall plan of care and treatment options, ensuring decisions are within the context of the patient's values and GOCs.  Questions and concerns were addressed. The patient and family was encouraged to call with questions or concerns.  PMT will continue to  support holistically.  Conference with attending, bedside nursing staff, transition of care team related to patient condition, needs, goals of care, disposition.   HCPOA NEXT OF KIN -wife, Rome Echavarria    SUMMARY OF RECOMMENDATIONS   At this point continue full scope/full code Time for outcomes Would like to discharge home, but would need prescription and equipment Richard Adams is working with PCP at [facility] to meet these needs so they may discharge   Code Status/Advance Care Planning: Full code  Symptom Management:  Per hospitalist, no additional needs at this time.  Palliative Prophylaxis:  Frequent  Pain Assessment and Turn Reposition  Additional Recommendations (Limitations, Scope, Preferences): Full Scope Treatment  Psycho-social/Spiritual:  Desire for further Chaplaincy support:no Additional Recommendations: Caregiving  Support/Resources and Education on Hospice  Prognosis:  Unable to determine, based on outcomes.  6 months or less would not be surprising based on chronic illness burden.  Discharge Planning:  At this point Mr. and Mrs. Adams would like to discharge home AMA.       Primary Diagnoses: Present on Admission:  Severe sepsis ([place])  Acute respiratory failure with hypoxia (HCC)  COPD (chronic obstructive pulmonary disease) (HCC)  Tobacco use disorder  Atrial fibrillation with RVR (HCC)  Chronic back pain  Aspiration pneumonia ([place])   I have reviewed the medical record, interviewed the patient and family, and examined the patient. The following aspects are pertinent.  Past Medical History:  Diagnosis Date   Alcohol dependence with intoxication ([place])    Alcohol induced insomnia (HCC)    Atrial fibrillation with RVR (HCC)    Chronic bronchitis (HCC)    COPD (chronic obstructive pulmonary disease) (HCC)    Diabetes mellitus without complication (HCC)    ED (erectile dysfunction)    GERD (gastroesophageal reflux disease)    Headache    History of subdural hematoma    Hypertension    Migraine    Mixed hyperlipidemia    PAF (paroxysmal atrial fibrillation) (HCC)    Screening for malignant neoplasm of respiratory organ    SOB (shortness of breath)    Tobacco use disorder    Type 2 diabetes mellitus with peripheral neuropathy (HCC)    Vertigo    Social History   Socioeconomic History   Marital status: Married    Spouse name: Not on file   Number of children: Not on file   Years of education: Not on file   Highest education level: Not on file  Occupational History   Not on file  Tobacco Use   Smoking status: Every Day    Packs/day: 1.00     Types: Cigarettes   Smokeless tobacco: Never  Vaping Use   Vaping Use: Never used  Substance and Sexual Activity   Alcohol use: Yes    Comment: 6-8 drink a day   Drug use: Never   Sexual activity: Not on file  Other Topics Concern   Not on file  Social History Narrative   Not on file   Social Determinants of Health   Financial Resource Strain: Not on file  Food Insecurity: Not on file  Transportation Needs: Not on file  Physical Activity: Not on file  Stress: Not on file  Social Connections: Not on file   History reviewed. No pertinent family history. Scheduled Meds:  budesonide (PULMICORT) nebulizer solution  0.25 mg Nebulization BID   Chlorhexidine Gluconate Cloth  6 each Topical Daily   enoxaparin (LOVENOX) injection  40 mg Subcutaneous Q24H   insulin aspart  0-15 Units Subcutaneous Q4H   ipratropium-albuterol  3 mL Nebulization Q6H   LORazepam  0.25 mg Intravenous Once   magic mouthwash w/lidocaine  15 mL Oral QID   methylPREDNISolone (SOLU-MEDROL) injection  80 mg Intravenous Q24H   nicotine  7 mg Transdermal Daily   pantoprazole (PROTONIX) IV  40 mg Intravenous Q24H   Continuous Infusions:  0.45 % NaCl with KCl 20 mEq / L     ampicillin-sulbactam (UNASYN) IV 3 g (03/24/22 1001)   diltiazem (CARDIZEM) infusion 7.5 mg/hr (03/24/22 0900)   PRN Meds:.HYDROmorphone (DILAUDID) injection, ipratropium-albuterol, ondansetron **OR** ondansetron (ZOFRAN) IV Medications Prior to Admission:  Prior to Admission medications   Medication Sig Start Date End Date Taking? Authorizing Provider  acetaminophen (TYLENOL CHILDRENS) 160 MG/5ML suspension Take 20 mLs by mouth every 6 (six) hours as needed. 11/27/21  Yes [provider]  albuterol (VENTOLIN HFA) 108 (90 Base) MCG/ACT inhaler Inhale 2 puffs into the lungs every 6 (six) hours as needed. 08/20/21 08/20/22 Yes [provider]  benzonatate (TESSALON) 200 MG capsule Take 200 mg by mouth 3 (three) times daily as  needed. 12/12/21  Yes [provider]  DPH-Lido-AlHydr-MgHydr-Simeth (FIRST-MOUTHWASH BLM) SUSP Take 5 mLs by mouth in the morning and at bedtime. 03/13/22  Yes [provider]  flecainide (TAMBOCOR) 100 MG tablet Take 100 mg by mouth 2 (two) times daily.   Yes [provider]  folic acid (FOLVITE) 1 MG tablet Take 1 mg by mouth daily.   Yes [provider]  gabapentin (NEURONTIN) 250 MG/5ML solution Take 12 mLs by mouth in the morning, at noon, and at bedtime. 08/12/21  Yes [provider]  hydrocortisone 1 % ointment Apply 1 Application topically daily.   Yes [provider]  Levothyroxine Sodium (TIROSINT-SOL) 137 MCG/ML SOLN Take 137 mcg by mouth in the morning. 11/22/21  Yes [provider]  loperamide (IMODIUM) 2 MG capsule Take 2 mg by mouth as needed for diarrhea or loose stools.   Yes [provider]  megestrol (MEGACE) 40 MG/ML suspension Take 10 mLs by mouth daily. 03/13/22 03/13/23 Yes [provider]  metFORMIN (GLUCOPHAGE) 500 MG tablet Take 500 mg by mouth 2 (two) times daily with a meal. 01/27/18 03/22/22 Yes [provider]  metoprolol succinate (TOPROL-XL) 25 MG 24 hr tablet Take 1 tablet by mouth daily. 03/04/18 03/22/22 Yes [provider]  miconazole (MICOTIN) 2 % cream Apply 1 Application topically 2 (two) times daily.   Yes [provider]  midodrine (PROAMATINE) 5 MG tablet Take 5 mg by mouth daily. 03/13/22 03/13/23 Yes [provider]  ondansetron (ZOFRAN) 8 MG tablet Take 8 mg by mouth every 8 (eight) hours as needed. 01/14/22  Yes [provider]  oxyCODONE (OXY IR/ROXICODONE) 5 MG immediate release tablet Take 10 mg by mouth every 4 (four) hours as needed. 03/06/22  Yes [provider]  pantoprazole (PROTONIX) 40 MG tablet Take 40 mg by mouth daily.   Yes [provider]  prochlorperazine (COMPAZINE) 10 MG tablet Take 10 mg by mouth every 6 (six)  hours as needed. 01/14/22  Yes [provider]  rosuvastatin (CRESTOR) 5 MG tablet Take 5 mg by mouth 3 (three) times a week. 07/30/18  Yes [provider]  tamsulosin (FLOMAX) 0.4 MG CAPS capsule Take 0.4 mg by mouth daily. Take one-half hour after the same meal each day 03/21/22  Yes [provider]  temazepam (RESTORIL) 15 MG capsule Take 15 mg by mouth  at bedtime as needed. 03/13/22 2022/05/06 Yes [provider]  thiamine (VITAMIN B1) 100 MG tablet Take 100 mg by mouth daily. 10/22/21 10/22/22 Yes [provider]  umeclidinium-vilanterol (ANORO ELLIPTA) 62.5-25 MCG/INH AEPB Inhale 1 puff into the lungs daily.   Yes [provider]  Wheat Dextrin (BENEFIBER DRINK MIX PO) Take 1 Bottle by mouth daily as needed (FIBER).   Yes [provider]  albuterol (PROVENTIL HFA;VENTOLIN HFA) 108 (90 Base) MCG/ACT inhaler Inhale into the lungs. 01/27/18 01/27/19  [provider]  EPINEPHrine 0.3 mg/0.3 mL IJ SOAJ injection as needed. 06/11/18   [provider]  famotidine (PEPCID) 20 MG tablet Take by mouth as needed. Patient not taking: Reported on 03/22/2022 06/11/18   [provider]  polyethylene glycol powder (GLYCOLAX/MIRALAX) 17 GM/SCOOP powder Take by mouth.    [provider]  triamcinolone ointment (KENALOG) 0.1 % as needed. 11/20/17   [provider]   Allergies  Allergen Reactions   Succinylcholine Other (See Comments)    Paralyzing for 3 days after last dose   Wasp Venom Protein Anaphylaxis   Sertraline Hcl Other (See Comments)    Felt like it burned the throat. Felt different in bad way   Naltrexone    Review of Systems  Unable to perform ROS: Age    Physical Exam Vitals and nursing note reviewed.  Constitutional:      General: He is not in acute distress.    Appearance: He is ill-appearing.  Cardiovascular:     Rate and Rhythm: Normal rate.  Pulmonary:     Effort: Pulmonary effort is  normal. No tachypnea.  Musculoskeletal:     Comments: Frail and thin, muscle wasting  Skin:    General: Skin is warm and dry.  Neurological:     Mental Status: He is alert and oriented to person, place, and time.  Psychiatric:        Mood and Affect: Mood normal.     Vital Signs: BP 113/78   Pulse 86   Temp 97.7 F (36.5 C) (Oral)   Resp 19   Ht _0  (1.854 m)   Wt 57.8 kg   SpO2 99%   BMI 16.81 kg/m  Pain Scale: 0-10   Pain Score: 3    SpO2: SpO2: 99 % O2 Device:SpO2: 99 % O2 Flow Rate: .O2 Flow Rate (L/min): 3 L/min  IO: Intake/output summary:  Intake/Output Summary (Last 24 hours) at 03/24/2022 1506 Last data filed at 03/24/2022 0900 Gross per 24 hour  Intake 1548.79 ml  Output 725 ml  Net 823.79 ml    LBM: Last BM Date : 03/23/22 Baseline Weight: Weight: 57.8 kg Most recent weight: Weight: 57.8 kg     Palliative Assessment/Data:   Flowsheet Rows    Flowsheet Row Most Recent Value  Intake Tab   Referral Department Hospitalist  Unit at Time of Referral Intermediate Care Unit  Palliative Care Primary Diagnosis Sepsis/Infectious Disease  Date Notified 03/23/22  Palliative Care Type New Palliative care  Reason for referral Clarify Goals of Care  Date of Admission 03/22/22  Date first seen by Palliative Care 03/24/22  # of days Palliative referral response time 1 Day(s)  # of days IP prior to Palliative referral 1  Clinical Assessment   Palliative Performance Scale Score 40%  Pain Max last 24 hours Not able to report  Pain Min Last 24 hours Not able to report  Dyspnea Max Last 24 Hours Not able to report  Dyspnea Min Last 24 hours Not able to report  Psychosocial & Spiritual Assessment   Palliative Care Outcomes        Time In: 1400 Time Out: 1455 Time Total: 55 minutes  Greater than 50%  of this time was spent counseling and coordinating care related to the above assessment and plan.  Signed by: Drue Novel, NP   Please contact Palliative  Medicine Team phone at [phone] for questions and concerns.  For individual provider: See Shea Evans

## 2022-03-24 NOTE — Progress Notes (Signed)
SLP Cancellation Note  Patient Details Name: Richard Adams MRN: [id number] DOB: Jun 16, 1951   Cancelled treatment:       Reason Eval/Treat Not Completed: Per chart review and discussion in interdisciplinary rounds, Palliative Consult pending. SLP to continue to follow pending further establishment of [place] given chroncity of pt's dysphagia in setting of esophageal cancer.  Cherrie Gauze, M.S., [facility] [phone] ([place])   Quintella Baton 03/24/2022, 12:12 PM

## 2022-03-25 ENCOUNTER — Inpatient Hospital Stay: Payer: Medicare Other

## 2022-03-25 DIAGNOSIS — J690 Pneumonitis due to inhalation of food and vomit: Secondary | ICD-10-CM | POA: Diagnosis not present

## 2022-03-25 DIAGNOSIS — Z515 Encounter for palliative care: Secondary | ICD-10-CM | POA: Diagnosis not present

## 2022-03-25 DIAGNOSIS — I4891 Unspecified atrial fibrillation: Secondary | ICD-10-CM | POA: Diagnosis not present

## 2022-03-25 DIAGNOSIS — Z7189 Other specified counseling: Secondary | ICD-10-CM

## 2022-03-25 DIAGNOSIS — A419 Sepsis, unspecified organism: Secondary | ICD-10-CM | POA: Diagnosis not present

## 2022-03-25 DIAGNOSIS — J9601 Acute respiratory failure with hypoxia: Secondary | ICD-10-CM | POA: Diagnosis not present

## 2022-03-25 LAB — GLUCOSE, CAPILLARY
Glucose-Capillary: 126 mg/dL — ABNORMAL HIGH (ref 70–99)
Glucose-Capillary: 128 mg/dL — ABNORMAL HIGH (ref 70–99)
Glucose-Capillary: 157 mg/dL — ABNORMAL HIGH (ref 70–99)
Glucose-Capillary: 176 mg/dL — ABNORMAL HIGH (ref 70–99)
Glucose-Capillary: 177 mg/dL — ABNORMAL HIGH (ref 70–99)
Glucose-Capillary: 179 mg/dL — ABNORMAL HIGH (ref 70–99)

## 2022-03-25 LAB — URINALYSIS, COMPLETE (UACMP) WITH MICROSCOPIC
Bacteria, UA: NONE SEEN
Bilirubin Urine: NEGATIVE
Glucose, UA: NEGATIVE mg/dL
Hgb urine dipstick: NEGATIVE
Ketones, ur: 5 mg/dL — AB
Leukocytes,Ua: NEGATIVE
Nitrite: NEGATIVE
Protein, ur: NEGATIVE mg/dL
Specific Gravity, Urine: 1.018 (ref 1.005–1.030)
Squamous Epithelial / HPF: NONE SEEN (ref 0–5)
WBC, UA: NONE SEEN WBC/hpf (ref 0–5)
pH: 7 (ref 5.0–8.0)

## 2022-03-25 LAB — CBC WITH DIFFERENTIAL/PLATELET
Abs Immature Granulocytes: 0.21 10*3/uL — ABNORMAL HIGH (ref 0.00–0.07)
Basophils Absolute: 0 10*3/uL (ref 0.0–0.1)
Basophils Relative: 0 %
Eosinophils Absolute: 0 10*3/uL (ref 0.0–0.5)
Eosinophils Relative: 0 %
HCT: 27.8 % — ABNORMAL LOW (ref 39.0–52.0)
Hemoglobin: 9 g/dL — ABNORMAL LOW (ref 13.0–17.0)
Immature Granulocytes: 1 %
Lymphocytes Relative: 2 %
Lymphs Abs: 0.3 10*3/uL — ABNORMAL LOW (ref 0.7–4.0)
MCH: 27.2 pg (ref 26.0–34.0)
MCHC: 32.4 g/dL (ref 30.0–36.0)
MCV: 84 fL (ref 80.0–100.0)
Monocytes Absolute: 0.2 10*3/uL (ref 0.1–1.0)
Monocytes Relative: 1 %
Neutro Abs: 18.5 10*3/uL — ABNORMAL HIGH (ref 1.7–7.7)
Neutrophils Relative %: 96 %
Platelets: 347 10*3/uL (ref 150–400)
RBC: 3.31 MIL/uL — ABNORMAL LOW (ref 4.22–5.81)
RDW: 17.1 % — ABNORMAL HIGH (ref 11.5–15.5)
WBC: 19.3 10*3/uL — ABNORMAL HIGH (ref 4.0–10.5)
nRBC: 0 % (ref 0.0–0.2)

## 2022-03-25 LAB — MAGNESIUM: Magnesium: 1.8 mg/dL (ref 1.7–2.4)

## 2022-03-25 LAB — BASIC METABOLIC PANEL
Anion gap: 7 (ref 5–15)
BUN: 15 mg/dL (ref 8–23)
CO2: 19 mmol/L — ABNORMAL LOW (ref 22–32)
Calcium: 8.9 mg/dL (ref 8.9–10.3)
Chloride: 105 mmol/L (ref 98–111)
Creatinine, Ser: 0.42 mg/dL — ABNORMAL LOW (ref 0.61–1.24)
GFR, Estimated: >60 mL/min (ref >60)
Glucose, Bld: 121 mg/dL — ABNORMAL HIGH (ref 70–99)
Potassium: 4.1 mmol/L (ref 3.5–5.1)
Sodium: 131 mmol/L — ABNORMAL LOW (ref 135–145)

## 2022-03-25 LAB — PHOSPHORUS: Phosphorus: 2.1 mg/dL — ABNORMAL LOW (ref 2.5–4.6)

## 2022-03-25 MED ORDER — IPRATROPIUM-ALBUTEROL 0.5-2.5 (3) MG/3ML IN SOLN
3.0000 mL | Freq: Two times a day (BID) | RESPIRATORY_TRACT | Status: DC
  Administered 2022-03-25 – 2022-03-26 (×2): 3 mL via RESPIRATORY_TRACT
  Filled 2022-03-25 (×2): qty 3

## 2022-03-25 MED ORDER — LORAZEPAM 2 MG/ML IJ SOLN
0.2500 mg | Freq: Four times a day (QID) | INTRAMUSCULAR | Status: DC | PRN
  Administered 2022-03-25: 0.25 mg via INTRAVENOUS
  Filled 2022-03-25: qty 1

## 2022-03-25 MED ORDER — MAGNESIUM SULFATE 2 GM/50ML IV SOLN
2.0000 g | Freq: Once | INTRAVENOUS | Status: AC
Start: 2022-03-25 — End: 2022-03-26
  Administered 2022-03-25: 2 g via INTRAVENOUS
  Filled 2022-03-25: qty 50

## 2022-03-25 MED ORDER — LORAZEPAM 2 MG/ML IJ SOLN
0.5000 mg | Freq: Four times a day (QID) | INTRAMUSCULAR | Status: DC | PRN
Start: 2022-03-25 — End: 2022-03-25

## 2022-03-25 MED ORDER — SODIUM CHLORIDE 0.45 % IV SOLN
INTRAVENOUS | Status: DC
  Administered 2022-03-25: 1000 mL via INTRAVENOUS

## 2022-03-25 MED ORDER — SODIUM CHLORIDE 0.9 % IV SOLN
INTRAVENOUS | Status: DC
  Administered 2022-03-25: 1000 mL via INTRAVENOUS

## 2022-03-25 MED ORDER — DEXTROSE 5 % IV SOLN
15.0000 mmol | Freq: Once | INTRAVENOUS | Status: DC
  Filled 2022-03-25: qty 5

## 2022-03-25 MED ORDER — DEXTROSE 5 % IV SOLN
15.0000 mmol | Freq: Once | INTRAVENOUS | Status: AC
Start: 2022-03-25 — End: 2022-03-26
  Administered 2022-03-25: 15 mmol via INTRAVENOUS
  Filled 2022-03-25: qty 5

## 2022-03-25 MED ORDER — K PHOS MONO-SOD PHOS DI & MONO 155-852-130 MG PO TABS
500.0000 mg | ORAL_TABLET | ORAL | Status: DC
  Filled 2022-03-25 (×2): qty 2

## 2022-03-25 NOTE — Ethics Note (Signed)
Ethics Committee Consult  Date: 03/25/2022 Time: 10:09 AM  Primary Committee Member:  Ronny Bacon Secondary Committee Member:  N/A  Does the patient have decision making capacity?  Yes  Does the patient have an Scientist, physiological or [place]?  Unknown  Legal Decision Maker:  Patient has capacity to make health decisions according to Attending Relationship to Patient:  N/A Mitigating Factors:  No  Name and contact information for person who requested the consult:  Jennye Boroughs, MD  Attending Physician:  Jennye Boroughs, MD Was attending physician notified?  Yes  Other individuals involved, present/attending, their names, relationship/role to the patient:  N/A phone conversation with the Attending and Ethics Committee member.  Individuals involved but unable to attend/participate their names, relationship/role to the patient:  N/A  Reason for the consult / ethical problem (eg. Goals of care, values clarification, forum for discussion, conflict resolution, autonomy, beneficence, etc.):  Patient wanting to leave AMA, wanting a liquid diet when Speech Therapist assessed NPO  Preliminary Information:  Dr. Mal Misty ran through basic patient condition. Issue is patient wants to eat/have liquid diet as in the past. Patient has pneumonia and receiving antibiotics. Aspiration suspected to have contributed to his pneumonia. Patient threatening to leave AMA. Dr. Mal Misty calling to discuss situation with ethics.  Objective facts related to or contributing to the problem (eg.  Pertinent medical history, facts of the case): Speech therapy recommended NPO. Patient does not want to remain NPO. Threatening to leave AMA. Dr. Mal Misty states patient is not in condition to be discharged.  Summary of consultation and recommendations: I asked Dr. Mal Misty if Palliative Care had been consulted to discuss with patient treatment options and goals of care. He said PC had been consulted and met  with patient. He does not think this went well. He also stated he discussed hospice option with patient and patient says he is not ready for hospice. Patient threatening to leave AMA. Does not desire to be NPO.  We discussed options - MD can discuss with patient the risks of liquid diet to his condition. Give patient informed consent in terms of whether to remain NPO or go on liquid diet.If he decides to provide liquid diet, he would need to document discussion of risks to patient and that patient was willing to take these risks. Physician can also do what he thinks is in the best interest of the patient. Patient is competent and able to make his own decisions and can leave AMA if he chooses. Ethics committee member suggested involving case management, risk management if needed, since ethics member is not giving legal advise.

## 2022-03-25 NOTE — Plan of Care (Signed)
  Problem: Fluid Volume: Goal: Hemodynamic stability will improve Outcome: Progressing   

## 2022-03-25 NOTE — Progress Notes (Signed)
Palliative: [name], [name], is sitting up in the [place] chair in his room.  He appears acutely/chronically ill and very frail.  He will briefly make but not keep eye contact.  He is quite sleepy throughout our visit but his wife, Richard Adams, shares that he received Ativan overnight.  He will wake when prompted, and is able to tell me his name, where we are, but is a little confused on time.  We talk in detail about the treatment plan including, but not limited to, A-fib and the use of Cardizem drip, n.p.o. status and inability to take by mouth Cardizem.  Richard Adams shares that Richard Adams had esophageal dilation about 1 year ago.  She talks about the treatment plan with their trusted providers at [facility].  She talks about his desire to eat for pleasure.  We talk in detail about finding a balance with "first to do no harm" and allowing Dyshaun to have foods for pleasure.  We talk about CODE STATUS, "treat the treatable, but allowing natural passing".  I shared that it is a lot harder to start something and take it away then it is to never start it.  During our conversation, Richard Adams is mostly asleep.    During our conversation, attending and bedside nursing arrived.  Attending talks about the treatment plan in detail, also talking about speech therapy consult and n.p.o. status, high white blood cells and need for continued IV antibiotics, reevaluation by speech therapy.  Conference with attending,bedside nursing staff, speech therapy, transition of care team related to patient condition, needs, goals of care.  Plan: At this point continue full scope/full code.  Reevaluation/MBBS per speech therapy.  68 minutes  Richard Axe, NP Palliative medicine team Team phone [phone] Greater than 50% of this time was spent counseling and coordinating care related to the above assessment and plan.

## 2022-03-25 NOTE — Progress Notes (Signed)
Walked into patients room and wife was standing at bedside giving patient broth with a straw. Patients wife was educated on risk of patient aspirating this morning By MD, Palliative Care NP and myself. I have reinforced several times during the day. Patient scheduled for MBS this afternoon with speech, reinforced with wife she is going against MD orders and could be causing more harm to the patient.  Jameelah Watts, Tivis Ringer, RN

## 2022-03-25 NOTE — Progress Notes (Signed)
Modified Barium Swallow Progress Note  Patient Details  Name: Richard Adams MRN: [id number] Date of Birth: [birth year]  Today's Date: 03/25/2022  Modified Barium Swallow completed.  Full report located under Chart Review in the Imaging Section.  Brief recommendations include the following:  Clinical Impression  Pt presents with moderate oropharyngeal dysphagia that results in silent aspiration and places pt at a high risk of further compromise. Pt's oral phase is c/b difficulty with posterior propulsion of thin liquids via spoon, via cup and straw as well as nectar thick liquids via spoon and cup. Decreased bolus cohesion results in premature spillage of all boluses and piecemeal swallows (~ 3 swallow per bolus). Pt's pharyngeal phase is c/b delayed swallow initiation with boluses resting in the pyriform sinuses before swallow is initiated. This results in consistent penetration during the swallow that accumulates and results in silent aspiration. Pt also presents with moderate pharyngeal residue in the vallecula and pyriform sinuses that appears to be aspirated with fluro is turned back on. At this time, pt is at a very high risk of aspirating when consuming thin liquids and nectar thick liquids. Will defer to Palliative Care to engage with pt and his regarding [place].   Swallow Evaluation Recommendations       SLP Diet Recommendations: NPO       Medication Administration: Via alternative means               Oral Care Recommendations: Oral care QID      Willard Madrigal B. Rutherford Nail, M.S., CCC-SLP, Mining engineer Certified Brain Injury [place]  [place] Office [phone] Ascom [phone] Fax [phone]

## 2022-03-25 NOTE — Plan of Care (Signed)
  Problem: Education: Goal: Knowledge of General Education information will improve Description: Including pain rating scale, medication(s)/side effects and non-pharmacologic comfort measures Outcome: Not Progressing   Problem: Coping: Goal: Level of anxiety will decrease Outcome: Not Progressing   

## 2022-03-25 NOTE — Progress Notes (Signed)
Progress Note    Richard Adams  GYF:[id number] DOB: [birth year]  DOA: 03/22/2022 PCP: [name], PA-C      Brief Narrative:    Medical records reviewed and are as summarized below:  Richard Adams is a 71 y.o. 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  Patient completed neoadjuvant chemoradiation therapy in June, 2021.  He was diagnosed with locally recurrent disease in March, 2022 and has pulmonary nodules suspicious for metastatic disease.  He is currently on single agent therapy with pembrolizumab which he gets every 3 weeks. Patient has worsening dysphagia and according to the wife he is only able to tolerate liquid diet at this time. Patient's wife also states that he was recently treated for aspiration pneumonia with Augmentin and azithromycin and has completed antibiotic therapy.  He presented to the hospital because of cough, worsening shortness of breath and oxygen desaturation 82%.  He was placed on CPAP by EMS.  He was also noted to be atrial fibrillation with RVR.  Lactic acid was 4.5 on admission.  Chest x-ray showed bilateral opacities.  He was admitted to the hospital for atrial fibrillation with RVR and severe sepsis secondary to aspiration pneumonia.     Assessment/Plan:   Principal Problem:   Severe sepsis (HCC) Active Problems:   Aspiration pneumonia (HCC)   Acute respiratory failure with hypoxia (HCC)   Atrial fibrillation with rapid ventricular response (HCC)   COPD (chronic obstructive pulmonary disease) (HCC)   Tobacco use disorder   Diabetes mellitus without complication (HCC)   Chronic back pain   Pressure injury of skin   Goals of care, counseling/discussion   Palliative care by  specialist   DNR (do not resuscitate) discussion    Body mass index is 16.81 kg/m.  (Underweight)  Severe sepsis secondary to aspiration pneumonia: He has persistent leukocytosis.  Continue IV Unasyn.  We will need at least 5 to 7 days of IV antibiotics because there is no safe oral option at this point.  Atrial fibrillation with RVR: He still has intermittent tachycardia.  Continue IV Cardizem drip and wean off as able.  Switching to oral medications has been difficult because of strict NPO.  Acute hypoxic respiratory failure: Initially required BiPAP.  He is saturating well on room air.  Use oxygen via nasal cannula as needed.  Hyponatremia and hypophosphatemia: Sodium and phosphorus are trending down.  Change half-normal saline to normal saline infusion.  IV phosphate will be used if phosphorus continues to trend down. Continue to monitor electrolytes  Hypokalemia: improved  Dysphagia: He was evaluated by the speech therapist.  Speech therapist continues to recommend strict NPO. Patient is being fed liquid diet by his wife despite NPO recommendations.   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  Anxiety: Decrease Ativan to 0.25 mg every 6 hours as needed for anxiety  Stage II decubitus ulcers on bilateral buttocks: Continue local wound care  Other comorbidities include chronic back pain, tobacco use disorder   Plan of care was discussed with the patient and his wife at the bedside.  There is consideration to liberalize his diet to liquid diet because he insists on eating a  liquid diet.  He understands that he is at increased risk for complications including but not limited to worsening aspiration pneumonia, severe sepsis with septic shock, acute respiratory failure, cardiopulmonary arrest and death.  This discussion was held in  the presence of Tasha, NP, with palliative care and Miranda, Therapist, sports.  Unfortunately, he was drowsy this morning likely from effects of Ativan and Dilaudid.  We will consider advancing diet to liquid diet when he is more awake and alert.   Diet Order             Diet NPO time specified Except for: Ice Chips  Diet effective now                            Consultants: Intensivist  Procedures: None    Medications:    budesonide (PULMICORT) nebulizer solution  0.25 mg Nebulization BID   Chlorhexidine Gluconate Cloth  6 each Topical Daily   enoxaparin (LOVENOX) injection  40 mg Subcutaneous Q24H   insulin aspart  0-15 Units Subcutaneous Q4H   ipratropium-albuterol  3 mL Nebulization BID   LORazepam  0.25 mg Intravenous Once   magic mouthwash w/lidocaine  15 mL Oral QID   methylPREDNISolone (SOLU-MEDROL) injection  80 mg Intravenous Q24H   nicotine  7 mg Transdermal Daily   pantoprazole (PROTONIX) IV  40 mg Intravenous Q24H   Continuous Infusions:  sodium chloride 1,000 mL (03/25/22 1435)   sodium chloride 10 mL/hr at 03/24/22 2111   ampicillin-sulbactam (UNASYN) IV 3 g (03/25/22 1442)   diltiazem (CARDIZEM) infusion 7.5 mg/hr (03/25/22 1127)   sodium phosphate 15 mmol in dextrose 5 % 250 mL infusion       Anti-infectives (From admission, onward)    Start     Dose/Rate Route Frequency Ordered Stop   03/22/22 1200  Ampicillin-Sulbactam (UNASYN) 3 g in sodium chloride 0.9 % 100 mL IVPB        3 g 200 mL/hr over 30 Minutes Intravenous Every 6 hours 03/22/22 1150     03/22/22 0745  ceFEPIme (MAXIPIME) 2 g in sodium chloride 0.9 % 100 mL IVPB        2 g 200 mL/hr over 30 Minutes Intravenous  Once 03/22/22 0742 03/22/22 0816   03/22/22 0745  metroNIDAZOLE (FLAGYL) IVPB 500 mg        500 mg 100 mL/hr over 60 Minutes Intravenous  Once 03/22/22 0742 03/22/22 1009   03/22/22 0745  vancomycin (VANCOCIN) IVPB 1000 mg/200 mL premix        1,000 mg 200 mL/hr over 60 Minutes  Intravenous  Once 03/22/22 6712 03/22/22 1011              Family Communication/Anticipated D/C date and plan/Code Status   DVT prophylaxis: enoxaparin (LOVENOX) injection 40 mg Start: 03/22/22 2200     Code Status: Full Code  Family Communication: Plan discussed with his wife Disposition Plan: Plan to discharge home in 2 to 3 days   Status is: Inpatient Remains inpatient appropriate because: On IV antibiotics for sepsis from pneumonia, on IV Cardizem drip for A-fib with RVR       Subjective:   Interval events noted.  He requested IV Dilaudid for back pain.  Of note, patient is more drowsy and not as alert as he usually is.  He had Ativan last night for anxiety.  His wife was at the bedside.  Aniceto Boss, NP, from palliative care and Jeannetta Nap, RN, were at  the bedside.  Objective:    Vitals:   03/25/22 0755 03/25/22 0842 03/25/22 1144 03/25/22 1625  BP: 122/86  111/83 111/77  Pulse: 93  81 90  Resp: '16  16 16  '$ Temp: 98.9 F (37.2 C)  97.9 F (36.6 C) 98.2 F (36.8 C)  TempSrc:    Oral  SpO2: 100% 97% 99% 95%  Weight:      Height:       No data found.   Intake/Output Summary (Last 24 hours) at 03/25/2022 1654 Last data filed at 03/25/2022 1500 Gross per 24 hour  Intake 1627.03 ml  Output 600 ml  Net 1027.03 ml   Filed Weights   03/22/22 0655  Weight: 57.8 kg    Exam:  GEN: NAD SKIN: Warm and dry EYES: No pallor or icterus ENT: MMM CV: Irregular rate, tachycardic PULM: CTA B ABD: soft, ND, NT, +BS CNS: Drowsy but arousable.  He is oriented to person and place but not time of this encounter.  Non focal EXT: No edema or tenderness      Data Reviewed:   I have personally reviewed following labs and imaging studies:  Labs: Labs show the following:   Basic Metabolic Panel: Recent Labs  Lab 03/22/22 0651 03/23/22 0420 03/24/22 0353 03/25/22 0515  NA 133* 137 133* 131*  K 3.5 3.4* 4.2 4.1  CL 100 107 106 105  CO2 19* 23 19* 19*  GLUCOSE  258* 106* 193* 121*  BUN '22 22 23 15  '$ CREATININE 0.79 0.62 0.66 0.42*  CALCIUM 9.9 9.0 9.1 8.9  MG 1.4* 1.7 1.8 1.8  PHOS  --  2.1* 2.3* 2.1*   GFR Estimated Creatinine Clearance: 70.2 mL/min (A) (by C-G formula based on SCr of 0.42 mg/dL (L)). Liver Function Tests: Recent Labs  Lab 03/22/22 0651  AST 57*  ALT 68*  ALKPHOS 55  BILITOT 0.6  PROT 8.7*  ALBUMIN 3.4*   No results for input(s): "LIPASE", "AMYLASE" in the last 168 hours. No results for input(s): "AMMONIA" in the last 168 hours. Coagulation profile Recent Labs  Lab 03/22/22 0729 03/23/22 0420  INR 1.1 1.5*    CBC: Recent Labs  Lab 03/22/22 0651 03/23/22 0420 03/24/22 0353 03/25/22 0515  WBC 18.5* 21.9* 18.6* 19.3*  NEUTROABS 17.2*  --  18.0* 18.5*  HGB 12.5* 9.1* 9.1* 9.0*  HCT 40.1 28.1* 27.8* 27.8*  MCV 87.6 84.9 85.8 84.0  PLT 594* 384 346 347   Cardiac Enzymes: No results for input(s): "CKTOTAL", "CKMB", "CKMBINDEX", "TROPONINI" in the last 168 hours. BNP (last 3 results) No results for input(s): "PROBNP" in the last 8760 hours. CBG: Recent Labs  Lab 03/24/22 2340 03/25/22 0406 03/25/22 0744 03/25/22 1143 03/25/22 1626  GLUCAP 155* 128* 126* 157* 177*   D-Dimer: No results for input(s): "DDIMER" in the last 72 hours. Hgb A1c: Recent Labs    03/22/22 1801  HGBA1C 5.7*   Lipid Profile: No results for input(s): "CHOL", "HDL", "LDLCALC", "TRIG", "CHOLHDL", "LDLDIRECT" in the last 72 hours. Thyroid function studies: No results for input(s): "TSH", "T4TOTAL", "T3FREE", "THYROIDAB" in the last 72 hours.  Invalid input(s): "FREET3"  Anemia work up: No results for input(s): "VITAMINB12", "FOLATE", "FERRITIN", "TIBC", "IRON", "RETICCTPCT" in the last 72 hours. Sepsis Labs: Recent Labs  Lab 03/22/22 0649 03/22/22 0651 03/22/22 0858 03/23/22 0420 03/23/22 1130 03/24/22 0353 03/25/22 0515  PROCALCITON  --  <0.10  --  3.91  --  2.20  --   WBC  --  18.5*  --  21.9*  --  18.6* 19.3*   LATICACIDVEN 4.5*  --  2.1*  --  1.3  --   --     Microbiology Recent Results (from the past 240 hour(s))  Blood Culture (routine x 2)     Status: None (Preliminary result)   Collection Time: 03/22/22  6:51 AM   Specimen: BLOOD  Result Value Ref Range Status   Specimen Description BLOOD BLOOD LEFT FOREARM  Final   Special Requests   Final    BOTTLES DRAWN AEROBIC AND ANAEROBIC Blood Culture results may not be optimal due to an inadequate volume of blood received in culture bottles   Culture   Final    NO GROWTH 3 DAYS Performed at [facility], [address] [place], [place] [address]    Report Status PENDING  Incomplete  Blood Culture (routine x 2)     Status: None (Preliminary result)   Collection Time: 03/22/22  6:52 AM   Specimen: BLOOD  Result Value Ref Range Status   Specimen Description BLOOD BLOOD RIGHT HAND  Final   Special Requests   Final    BOTTLES DRAWN AEROBIC AND ANAEROBIC Blood Culture adequate volume   Culture   Final    NO GROWTH 3 DAYS Performed at [facility], [address] [place], [place] [address]    Report Status PENDING  Incomplete  SARS Coronavirus 2 by RT PCR (hospital order, performed in [place] hospital lab) *cepheid single result test* Anterior Nasal Swab     Status: None   Collection Time: 03/22/22  6:54 AM   Specimen: Anterior Nasal Swab  Result Value Ref Range Status   SARS Coronavirus 2 by RT PCR NEGATIVE NEGATIVE Final    Comment: (NOTE) SARS-CoV-2 target nucleic acids are NOT DETECTED.  The SARS-CoV-2 RNA is generally detectable in upper and lower respiratory specimens during the acute phase of infection. The lowest concentration of SARS-CoV-2 viral copies this assay can detect is 250 copies / mL. A negative result does not preclude SARS-CoV-2 infection and should not be used as the sole basis for treatment or other patient management decisions.  A negative result may occur with improper specimen collection  / handling, submission of specimen other than nasopharyngeal swab, presence of viral mutation(s) within the areas targeted by this assay, and inadequate number of viral copies (<250 copies / mL). A negative result must be combined with clinical observations, patient history, and epidemiological information.  Fact Sheet for Patients:   https://www.patel.info/  Fact Sheet for Healthcare Providers: https://hall.com/  This test is not yet approved or  cleared by the [place] FDA and has been authorized for detection and/or diagnosis of SARS-CoV-2 by FDA under an Emergency Use Authorization (EUA).  This EUA will remain in effect (meaning this test can be used) for the duration of the COVID-19 declaration under Section 564(b)(1) of the Act, 21 U.S.C. section 360bbb-3(b)(1), unless the authorization is terminated or revoked sooner.  Performed at [facility], [place], [place] [address]   Respiratory (~20 pathogens) panel by PCR     Status: None   Collection Time: 03/22/22  5:35 PM   Specimen: Nasopharyngeal Swab; Respiratory  Result Value Ref Range Status   Adenovirus NOT DETECTED NOT DETECTED Final   Coronavirus 229E NOT DETECTED NOT DETECTED Final    Comment: (NOTE) The Coronavirus on the Respiratory Panel, DOES NOT test for the novel  Coronavirus (2019 nCoV)    Coronavirus HKU1 NOT DETECTED  NOT DETECTED Final   Coronavirus NL63 NOT DETECTED NOT DETECTED Final   Coronavirus OC43 NOT DETECTED NOT DETECTED Final   Metapneumovirus NOT DETECTED NOT DETECTED Final   Rhinovirus / Enterovirus NOT DETECTED NOT DETECTED Final   Influenza A NOT DETECTED NOT DETECTED Final   Influenza B NOT DETECTED NOT DETECTED Final   Parainfluenza Virus 1 NOT DETECTED NOT DETECTED Final   Parainfluenza Virus 2 NOT DETECTED NOT DETECTED Final   Parainfluenza Virus 3 NOT DETECTED NOT DETECTED Final   Parainfluenza Virus 4 NOT  DETECTED NOT DETECTED Final   Respiratory Syncytial Virus NOT DETECTED NOT DETECTED Final   Bordetella pertussis NOT DETECTED NOT DETECTED Final   Bordetella Parapertussis NOT DETECTED NOT DETECTED Final   Chlamydophila pneumoniae NOT DETECTED NOT DETECTED Final   Mycoplasma pneumoniae NOT DETECTED NOT DETECTED Final    Comment: Performed at [facility], [place] [address], [place], [place] [address]  MRSA Next Gen by PCR, Nasal     Status: None   Collection Time: 03/22/22  8:36 PM   Specimen: Nasal Mucosa; Nasal Swab  Result Value Ref Range Status   MRSA by PCR Next Gen NOT DETECTED NOT DETECTED Final    Comment: (NOTE) The GeneXpert MRSA Assay (FDA approved for NASAL specimens only), is one component of a comprehensive MRSA colonization surveillance program. It is not intended to diagnose MRSA infection nor to guide or monitor treatment for MRSA infections. Test performance is not FDA approved in patients less than 73 years old. Performed at [facility], [address] [place], [place] [address]     Procedures and diagnostic studies:  DG Swallowing Func-Speech Pathology  Result Date: 03/25/2022 Table formatting from the original result was not included. Images from the original result were not included. 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[place])   Atrial fibrillation with RVR (HCC)   Chronic bronchitis (HCC)   COPD (chronic obstructive pulmonary disease) ([place])   Diabetes mellitus without complication ([place])   ED (erectile dysfunction)   GERD (gastroesophageal reflux disease)   Headache    History of subdural hematoma   Hypertension   Migraine   Mixed hyperlipidemia   PAF (paroxysmal atrial fibrillation) ([place])   Screening for malignant neoplasm of respiratory organ   SOB (shortness of breath)   Tobacco use disorder   Type 2 diabetes mellitus with peripheral neuropathy (HCC)   Vertigo  Past Surgical History: Past Surgical History: Procedure Laterality Date  COLONOSCOPY    ESOPHAGOGASTRODUODENOSCOPY (EGD) WITH PROPOFOL N/A 12/14/2019  Procedure: ESOPHAGOGASTRODUODENOSCOPY (EGD) WITH PROPOFOL;  Surgeon: Toledo, Benay Pike, MD;  Location: [facility] ENDOSCOPY;  Service: Gastroenterology;  Laterality: N/A;  FLEXIBLE BRONCHOSCOPY W/ UPPER ENDOSCOPY   HPI: Per H&P "Zakye Baby is a 71 y.o. 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  Patient completed neoadjuvant chemoradiation therapy in June, 2021.  He was diagnosed with locally recurrent disease in March, 2022 and has pulmonary nodules suspicious for  metastatic disease.  He is currently on single agent therapy with pembrolizumab which he gets every 3 weeks.  Patient has worsening dysphagia and according to the wife he is only able to tolerate liquid diet at this time.  Patient's wife also states that he was recently treated for aspiration pneumonia with Augmentin and azithromycin and has completed antibiotic therapy.  Patient's wife called EMS earlier this morning because of worsening shortness of breath.  Per EMS patient had room air pulse oximetry of 82% and was placed on CPAP with improvement in his pulse oximetry to 93%.  He was noted to be in rapid A-fib with heart rate between 89 and 171.  He has a cough productive of beige phlegm but denies having any fever or chills.  He has no nausea, no  vomiting, no dizziness, no lightheadedness, no leg swelling, no headache, no blurred vision no focal deficit.  Labs show an initial lactate of 4.5 which shows a downward trend as well as marked leukocytosis.  Chest x-ray shows bilateral nodular opacities which may reflect underlying malignancy as well as diffuse reticulonodular opacities throughout the right lung and left lower lobe which may reflect superimposed inflammation or infection.  Patient received aggressive IV fluid resuscitation and antibiotic therapy and is currently on a Cardizem drip.  He will be admitted to the hospital for further evaluation."  Subjective: "let's get this over with"  Recommendations for follow up therapy are one component of a multi-disciplinary discharge planning process, led by the attending physician.  Recommendations may be updated based on patient status, additional functional criteria and insurance authorization. Assessment / Plan / Recommendation   03/25/2022   4:00 PM Clinical Impressions Clinical Impression Pt presents with moderate oropharyngeal dysphagia that results in silent aspiration and places pt at a high risk of further compromise. Pt's oral phase is c/b difficulty with postior propulsion of thin liquids via spoon, via cup and straw as well as nectar thick liquids via spoon and cup. Decreased bolus cohesion results in premature spillage of all boluses and piecemeal swallows (~ 3 swallow per bolus). Pt's pharyngeal phase is c/b delayed swallow initiation with boluses resting in the pyriform sinuses before swallow is initiated. This results in consistent penetration during the swallow that accumulates and results in silent aspiration. Pt also presents with moderate pharyngeal residue in the vallecula and pyriform sinuses that appears to be aspirated with fluro is turned back on. At this time, pt is at a very high risk of aspirating when consuming thin liquids and nectar thick liquids. Will defer to Palliative Care to  engage with pt and his regarding [place]. SLP Visit Diagnosis Dysphagia, oropharyngeal phase (R13.12) Impact on safety and function Moderate aspiration risk;Risk for inadequate nutrition/hydration     03/25/2022   4:00 PM Treatment Recommendations Treatment Recommendations No treatment recommended at this time     03/25/2022   4:00 PM Prognosis Prognosis for Safe Diet Advancement Guarded Barriers to Reach Goals Severity of deficits;Time post onset   03/25/2022   4:00 PM Diet Recommendations SLP Diet Recommendations NPO Medication Administration Via alternative means     03/25/2022   4:00 PM Other Recommendations Oral Care Recommendations Oral care QID Follow Up Recommendations No SLP follow up Functional Status Assessment Patient has not had a recent decline in their functional status    No data to display        03/25/2022   4:00 PM Oral Phase Oral Phase Impaired Oral - Nectar Teaspoon Weak lingual manipulation;Reduced posterior  propulsion;Holding of bolus;Piecemeal swallowing;Delayed oral transit;Decreased bolus cohesion;Premature spillage;Lingual/palatal residue Oral - Nectar Cup Weak lingual manipulation;Reduced posterior propulsion;Holding of bolus;Piecemeal swallowing;Delayed oral transit;Decreased bolus cohesion;Premature spillage;Lingual/palatal residue Oral - Thin Teaspoon Weak lingual manipulation;Reduced posterior propulsion;Holding of bolus;Lingual/palatal residue;Piecemeal swallowing;Delayed oral transit;Decreased bolus cohesion;Premature spillage Oral - Thin Cup Weak lingual manipulation;Reduced posterior propulsion;Holding of bolus;Lingual/palatal residue;Piecemeal swallowing;Delayed oral transit;Decreased bolus cohesion;Premature spillage Oral - Thin Straw Weak lingual manipulation;Reduced posterior propulsion;Holding of bolus;Lingual/palatal residue;Piecemeal swallowing;Delayed oral transit;Decreased bolus cohesion;Premature spillage    03/25/2022   4:00 PM Pharyngeal Phase Pharyngeal Phase Impaired Pharyngeal-  Nectar Teaspoon Delayed swallow initiation-pyriform sinuses;Reduced pharyngeal peristalsis;Reduced epiglottic inversion;Reduced airway/laryngeal closure;Penetration/Aspiration during swallow;Pharyngeal residue - valleculae;Pharyngeal residue - pyriform Pharyngeal Material enters airway, passes BELOW cords without attempt by patient to eject out (silent aspiration) Pharyngeal- Nectar Cup Delayed swallow initiation-pyriform sinuses;Reduced pharyngeal peristalsis;Reduced epiglottic inversion;Reduced airway/laryngeal closure;Penetration/Apiration after swallow;Penetration/Aspiration during swallow;Pharyngeal residue - valleculae;Pharyngeal residue - pyriform Pharyngeal Material enters airway, passes BELOW cords without attempt by patient to eject out (silent aspiration) Pharyngeal- Thin Teaspoon Delayed swallow initiation-pyriform sinuses;Reduced pharyngeal peristalsis;Reduced epiglottic inversion;Reduced airway/laryngeal closure;Penetration/Aspiration during swallow;Penetration/Apiration after swallow;Pharyngeal residue - valleculae;Pharyngeal residue - pyriform Pharyngeal Material enters airway, passes BELOW cords without attempt by patient to eject out (silent aspiration) Pharyngeal- Thin Cup Delayed swallow initiation-pyriform sinuses;Reduced pharyngeal peristalsis;Reduced epiglottic inversion;Reduced airway/laryngeal closure;Penetration/Aspiration during swallow;Penetration/Apiration after swallow;Pharyngeal residue - valleculae;Pharyngeal residue - pyriform Pharyngeal Material enters airway, passes BELOW cords without attempt by patient to eject out (silent aspiration) Pharyngeal- Thin Straw Delayed swallow initiation-pyriform sinuses;Reduced pharyngeal peristalsis;Reduced epiglottic inversion;Reduced airway/laryngeal closure;Penetration/Aspiration during swallow;Penetration/Apiration after swallow;Pharyngeal residue - valleculae;Pharyngeal residue - pyriform Pharyngeal Material enters airway, passes BELOW cords  without attempt by patient to eject out (silent aspiration)    03/25/2022   4:00 PM Cervical Esophageal Phase  Cervical Esophageal Phase [facility] Happi Overton 03/25/2022, 4:27 PM                                 LOS: 3 days   Hollis Oh  [facility]   Pager on [email or web address]. If 7PM-7AM, please contact night-coverage at [email or web address]     03/25/2022, 4:54 PM

## 2022-03-26 ENCOUNTER — Telehealth: Payer: Self-pay | Admitting: Speech Pathology

## 2022-03-26 DIAGNOSIS — A419 Sepsis, unspecified organism: Secondary | ICD-10-CM | POA: Diagnosis not present

## 2022-03-26 DIAGNOSIS — I4891 Unspecified atrial fibrillation: Secondary | ICD-10-CM | POA: Diagnosis not present

## 2022-03-26 DIAGNOSIS — Z515 Encounter for palliative care: Secondary | ICD-10-CM | POA: Diagnosis not present

## 2022-03-26 DIAGNOSIS — R6520 Severe sepsis without septic shock: Secondary | ICD-10-CM | POA: Diagnosis not present

## 2022-03-26 DIAGNOSIS — Z7189 Other specified counseling: Secondary | ICD-10-CM | POA: Diagnosis not present

## 2022-03-26 LAB — URINE CULTURE: Culture: NO GROWTH

## 2022-03-26 LAB — MAGNESIUM: Magnesium: 1.7 mg/dL (ref 1.7–2.4)

## 2022-03-26 LAB — GLUCOSE, CAPILLARY
Glucose-Capillary: 174 mg/dL — ABNORMAL HIGH (ref 70–99)
Glucose-Capillary: 163 mg/dL — ABNORMAL HIGH (ref 70–99)
Glucose-Capillary: 201 mg/dL — ABNORMAL HIGH (ref 70–99)
Glucose-Capillary: 186 mg/dL — ABNORMAL HIGH (ref 70–99)

## 2022-03-26 LAB — PHOSPHORUS: Phosphorus: 3.3 mg/dL (ref 2.5–4.6)

## 2022-03-26 MED ORDER — MORPHINE SULFATE ER 30 MG PO TBCR: 30.0000 mg | EXTENDED_RELEASE_TABLET | Freq: Three times a day (TID) | ORAL | 0 refills | Status: DC | PRN

## 2022-03-26 MED ORDER — AMOXICILLIN-POT CLAVULANATE 250-62.5 MG/5ML PO SUSR: 250.0000 mg | Freq: Two times a day (BID) | ORAL | 0 refills | Status: DC

## 2022-03-26 MED ORDER — STERILE WATER FOR INJECTION IJ SOLN
INTRAMUSCULAR | Status: AC
  Administered 2022-03-26: 2 mL
  Filled 2022-03-26: qty 10

## 2022-03-26 MED ORDER — MAGNESIUM SULFATE 2 GM/50ML IV SOLN
2.0000 g | Freq: Once | INTRAVENOUS | Status: AC
Start: 2022-03-26 — End: 2022-03-26
  Administered 2022-03-26: 2 g via INTRAVENOUS
  Filled 2022-03-26: qty 50

## 2022-03-26 NOTE — Progress Notes (Signed)
SLP Follow Up Note  Patient Details Name: Richard Adams MRN: [id number] DOB: 07/05/1951  I spoke with pt's wife briefly on the phone and then met with her, pt and Tasha (Palliative Care NP) regarding pt's recent Modified Barium Swallow Study. I explained silent aspiration, high risk of developing aspiration pneumonia as well as reviewing video of Modified Barium Swallow Study. SLP also explained exacerbating medical deconditioning and baseline use of morphine and oxycodone for pain control.   During the session pt was awake alert briefing throughout but was not oriented to any information without multiple choices. He continued to fall asleep but is requesting to go home today. Pt's wife was eager to received information but she remained blissfully unaware of the severity of pt's current medical condition.   At this time, all questions have been answered and all education as been completed.   Imagene Boss B. Rutherford Nail, M.S., CCC-SLP, Mining engineer Certified Brain Injury [place]  [facility] Office [phone] Ascom [phone] Fax [phone]

## 2022-03-26 NOTE — Hospital Course (Addendum)
Taken from prior notes.  Richard Adams is a 71 y.o. 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  Patient completed neoadjuvant chemoradiation therapy in June, 2021.  He was diagnosed with locally recurrent disease in March, 2022 and has pulmonary nodules suspicious for metastatic disease.  He is currently on single agent therapy with pembrolizumab which he gets every 3 weeks. Patient has worsening dysphagia and according to the wife he is only able to tolerate liquid diet at this time. Patient's wife also states that he was recently treated for aspiration pneumonia with Augmentin and azithromycin and has completed antibiotic therapy.   He presented to the hospital because of cough, worsening shortness of breath and oxygen desaturation 82%.  He was placed on CPAP by EMS.  He was also noted to be atrial fibrillation with RVR.  Lactic acid was 4.5 on admission.  Chest x-ray showed bilateral opacities.  He was admitted to the hospital for atrial fibrillation with RVR and severe sepsis secondary to aspiration pneumonia.  8/9: Patient remained high risk for aspiration per speech evaluation.  Palliative care was also consulted.  Wife would like to keep him full code with full scope of medical care but also asking for pleasure feed.  Speech eval recommendations are strict n.p.o. Patient remained on Cardizem infusion and is unable to take any p.o. Phosphorous has been normalized, magnesium at lower normal limit-ordered another repletion. Palliative care met with patient again and patient is being discharged at his request as he would like to continue eating for pleasure.  Still has not decided about hospice but will follow-up with his palliative care  provider from [facility]. We discussed again the risk of aspiration in his case and he understands. He is being given Augmentin and asked to discuss with his doctors if he need to continue on long-term antibiotics.  He was also given home oxygen as he continued to desaturate on room air.  Patient is being discharged at his request as he really wants to go home and will follow-up with his providers at [facility] for further recommendations.  Patient is very high risk for deterioration and death based on his underlying comorbidities and risk of aspiration which he completely understands.  Wife was at bedside and also requesting discharge.  Patient will continue the rest of his home medications and follow-up with his providers.

## 2022-03-26 NOTE — Telephone Encounter (Signed)
I was made aware that pt's wife had questions about recent Modified Barium Swallow Study, I called the number provided by pt's nurse [phone]. Unable to connect to this number. Called number listed in chart and was able to speak to pt's wife. Proceeded upstairs with Palliative Care NP Aniceto Boss) to answer any questions.   Jenefer Woerner B. Rutherford Nail, M.S., CCC-SLP, Mining engineer Certified Brain Injury [place]  [place] Office [phone] Ascom [phone] Fax [phone]

## 2022-03-26 NOTE — Discharge Summary (Signed)
Physician Discharge Summary   Patient: Richard Adams MRN: [id number] DOB: 1951-06-06  Admit date:     03/22/2022  Discharge date: 03/26/22  Discharge Physician: Lorella Nimrod   PCP: [name], PA-C   Recommendations at discharge:  Patient is very high risk for aspiration, being discharged on Augmentin.  Please review the need for long-term antibiotics. Patient need continuation of goals of care discussion which he wants to do with his on palliative care provider at [facility]. Patient currently not ready for hospice but will be very high risk for deterioration and death.  Discharge Diagnoses: Principal Problem:   Severe sepsis ([place]) Active Problems:   Aspiration pneumonia (HCC)   Acute respiratory failure with hypoxia (HCC)   Atrial fibrillation with rapid ventricular response (HCC)   COPD (chronic obstructive pulmonary disease) (HCC)   Tobacco use disorder   Diabetes mellitus without complication (HCC)   Chronic back pain   Pressure injury of skin   Goals of care, counseling/discussion   Palliative care by specialist   DNR (do not resuscitate) discussion   Hospital Course: Taken from prior notes.  Richard Adams is a 71 y.o. 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  Patient completed neoadjuvant chemoradiation therapy in June, 2021.  He was diagnosed with locally recurrent disease in March, 2022 and has pulmonary nodules suspicious for metastatic disease.  He is currently on single agent therapy with pembrolizumab which he gets every 3 weeks. Patient has worsening dysphagia and according to the wife he is only able to tolerate liquid diet at this time. Patient's wife also states that he was recently treated for  aspiration pneumonia with Augmentin and azithromycin and has completed antibiotic therapy.   He presented to the hospital because of cough, worsening shortness of breath and oxygen desaturation 82%.  He was placed on CPAP by EMS.  He was also noted to be atrial fibrillation with RVR.  Lactic acid was 4.5 on admission.  Chest x-ray showed bilateral opacities.  He was admitted to the hospital for atrial fibrillation with RVR and severe sepsis secondary to aspiration pneumonia.  8/9: Patient remained high risk for aspiration per speech evaluation.  Palliative care was also consulted.  Wife would like to keep him full code with full scope of medical care but also asking for pleasure feed.  Speech eval recommendations are strict n.p.o. Patient remained on Cardizem infusion and is unable to take any p.o. Phosphorous has been normalized, magnesium at lower normal limit-ordered another repletion. Palliative care met with patient again and patient is being discharged at his request as he would like to continue eating for pleasure.  Still has not decided about hospice but will follow-up with his palliative care provider from [facility]. We discussed again the risk of aspiration in his case and he understands. He is being given Augmentin and asked to discuss with his doctors if he need to continue on long-term antibiotics.  He was also given home oxygen as he continued to desaturate on room air.  Patient is being discharged at his request as he really wants to go home and will follow-up with his providers at [facility] for further recommendations.  Patient is very high risk for deterioration and death based on his underlying comorbidities and risk of aspiration  which he completely understands.  Wife was at bedside and also requesting discharge.  Patient will continue the rest of his home medications and follow-up with his providers.  Assessment and Plan: * Severe sepsis ([place]) As evidenced  by tachycardia, tachypnea, marked leukocytosis, lactic acidosis and chest x-ray findings suggestive of aspiration pneumonia. Patient also has acute respiratory failure secondary to aspiration pneumonia Continue aggressive IV fluid resuscitation Trend lactic acid levels Place patient on IV Unasyn N.p.o. for now Follow-up results of blood cultures  Aspiration pneumonia ([place]) Secondary to dysphagia from known squamous cell carcinoma of the esophagus. Patient is only able to tolerate liquid diet at this time Recently treated as an outpatient for aspiration pneumonia We will place patient on IV Unasyn Keep n.p.o. for now Speech therapy consult when stable  Acute respiratory failure with hypoxia ([place]) Most likely secondary to aspiration pneumonia Patient was hypoxic in the field with room air pulse oximetry of 82% associated with increased work of breathing and tachypnea. He is currently on a BiPAP to reduce work of breathing and improve oxygenation. We will attempt to wean off BiPAP once acute illness improves or resolves.  Atrial fibrillation with rapid ventricular response ([place]) Patient with a history of paroxysmal A-fib not on anticoagulation due to life-threatening GI bleed. Noted to be in rapid A-fib upon presentation most likely secondary to sepsis. Continue Cardizem drip initiated in the ER Hold flecainide and metoprolol for now until patient is able to resume p.o.  COPD (chronic obstructive pulmonary disease) ([place]) Place patient on as needed and scheduled bronchodilator therapy Place patient on inhaled steroids as well Continue oxygen supplementation to maintain pulse oximetry greater than 92%  Tobacco use disorder Patient continues to smoke cigarettes but according to the wife he is down to 1 to 4 cigarettes daily but he also vapes. We will place patient on a nicotine transdermal patch. Smoking cessation has been discussed with patient in detail  Diabetes mellitus without  complication ([place]) Keep patient n.p.o. for now Hold metformin Check blood sugars every 4 hours  Chronic back pain Patient has a history of chronic back pain and is on morphine oral and oxycodone at home. We will place patient on IV morphine while n.p.o.   Consultants: Palliative care Procedures performed: None Disposition: Home Diet recommendation:  Discharge Diet Orders (From admission, onward)     Start     Ordered   03/26/22 0000  Diet - low sodium heart healthy        03/26/22 1306           Cardiac diet DISCHARGE MEDICATION: Allergies as of 03/26/2022       Reactions   Succinylcholine Other (See Comments)   Paralyzing for 3 days after last dose   Wasp Venom Protein Anaphylaxis   Sertraline Hcl Other (See Comments)   Felt like it burned the throat. Felt different in bad way   Naltrexone         Medication List     STOP taking these medications    dexamethasone 0.5 MG/5ML elixir       TAKE these medications    albuterol 108 (90 Base) MCG/ACT inhaler Commonly known as: VENTOLIN HFA Inhale 2 puffs into the lungs every 6 (six) hours as needed. What changed: Another medication with the same name was removed. Continue taking this medication, and follow the directions you see here.   amoxicillin-clavulanate 250-62.5 MG/5ML suspension Commonly known as: AUGMENTIN Take 5 mLs (250 mg total) by mouth 2 (two)  times daily for 10 days.   Anoro Ellipta 62.5-25 MCG/ACT Aepb Generic drug: umeclidinium-vilanterol Inhale 1 puff into the lungs daily.   BENEFIBER DRINK MIX PO Take 1 Bottle by mouth daily as needed (FIBER).   benzonatate 200 MG capsule Commonly known as: TESSALON Take 200 mg by mouth 3 (three) times daily as needed.   EPINEPHrine 0.3 mg/0.3 mL Soaj injection Commonly known as: EPI-PEN as needed.   famotidine 20 MG tablet Commonly known as: PEPCID Take by mouth as needed.   First-Mouthwash BLM Susp Take 5 mLs by mouth in the morning and at  bedtime.   flecainide 100 MG tablet Commonly known as: TAMBOCOR Take 100 mg by mouth 2 (two) times daily.   folic acid 1 MG tablet Commonly known as: FOLVITE Take 1 mg by mouth daily.   gabapentin 250 MG/5ML solution Commonly known as: NEURONTIN Take 12 mLs by mouth in the morning, at noon, and at bedtime.   hydrocortisone 1 % ointment Apply 1 Application topically daily.   loperamide 2 MG capsule Commonly known as: IMODIUM Take 2 mg by mouth as needed for diarrhea or loose stools.   megestrol 40 MG/ML suspension Commonly known as: MEGACE Take 10 mLs by mouth daily.   metFORMIN 500 MG tablet Commonly known as: GLUCOPHAGE Take 500 mg by mouth 2 (two) times daily with a meal.   metoprolol succinate 25 MG 24 hr tablet Commonly known as: TOPROL-XL Take 1 tablet by mouth daily.   miconazole 2 % cream Commonly known as: MICOTIN Apply 1 Application topically 2 (two) times daily.   midodrine 5 MG tablet Commonly known as: PROAMATINE Take 5 mg by mouth daily.   morphine 30 MG 12 hr tablet Commonly known as: MS CONTIN Take 1 tablet (30 mg total) by mouth every 8 (eight) hours as needed for up to 6 days.   ondansetron 8 MG tablet Commonly known as: ZOFRAN Take 8 mg by mouth every 8 (eight) hours as needed.   oxyCODONE 5 MG immediate release tablet Commonly known as: Oxy IR/ROXICODONE Take 10 mg by mouth every 4 (four) hours as needed.   polyethylene glycol powder 17 GM/SCOOP powder Commonly known as: GLYCOLAX/MIRALAX Take by mouth.   prochlorperazine 10 MG tablet Commonly known as: COMPAZINE Take 10 mg by mouth every 6 (six) hours as needed.   Protonix 40 MG tablet Generic drug: pantoprazole Take 40 mg by mouth daily.   rosuvastatin 5 MG tablet Commonly known as: CRESTOR Take 5 mg by mouth 3 (three) times a week.   tamsulosin 0.4 MG Caps capsule Commonly known as: FLOMAX Take 0.4 mg by mouth daily. Take one-half hour after the same meal each day    temazepam 15 MG capsule Commonly known as: RESTORIL Take 15 mg by mouth at bedtime as needed.   thiamine 100 MG tablet Commonly known as: VITAMIN B1 Take 100 mg by mouth daily.   Tirosint-SOL 137 MCG/ML Soln Generic drug: Levothyroxine Sodium Take 137 mcg by mouth in the morning.   triamcinolone ointment 0.1 % Commonly known as: KENALOG as needed.   Tylenol Childrens 160 MG/5ML suspension Generic drug: acetaminophen Take 20 mLs by mouth every 6 (six) hours as needed.               Durable Medical Equipment  (From admission, onward)           Start     Ordered   03/26/22 1259  DME Oxygen  Once  Question Answer Comment  Length of Need Lifetime   Mode or (Route) Nasal cannula   Liters per Minute 4   Frequency Continuous (stationary and portable oxygen unit needed)   Oxygen conserving device Yes   Oxygen delivery system Gas      03/26/22 1306              Discharge Care Instructions  (From admission, onward)           Start     Ordered   03/26/22 0000  No dressing needed        03/26/22 1306            Follow-up Information     [name], PA-C. Schedule an appointment as soon as possible for a visit in 1 week(s).   Specialty: Family Medicine Contact information: [place] [address] [phone]                Discharge Exam: Danley Danker Weights   03/22/22 0655  Weight: 57.8 kg   General.  Severely malnourished gentleman, in no acute distress. Pulmonary.  Lungs clear bilaterally, normal respiratory effort. CV.  Regular rate and rhythm, no JVD, rub or murmur. Abdomen.  Soft, nontender, nondistended, BS positive. CNS.  Alert and oriented .  No focal neurologic deficit. Extremities.  No edema, no cyanosis, pulses intact and symmetrical. Psychiatry.  Judgment and insight appears normal.   Condition at discharge: stable  The results of significant diagnostics from this hospitalization (including  imaging, microbiology, ancillary and laboratory) are listed below for reference.   Imaging Studies: DG Swallowing Func-Speech Pathology  Result Date: 03/25/2022 Table formatting from the original result was not included. Images from the original result were not included. 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[place])   Atrial fibrillation with RVR (HCC)   Chronic bronchitis (HCC)   COPD (chronic obstructive pulmonary disease) ([place])   Diabetes mellitus without complication ([place])   ED (erectile dysfunction)   GERD (gastroesophageal reflux disease)   Headache   History of subdural hematoma   Hypertension   Migraine   Mixed hyperlipidemia   PAF (paroxysmal atrial fibrillation) ([place])   Screening for malignant neoplasm of respiratory organ   SOB (shortness of breath)   Tobacco use disorder   Type 2 diabetes mellitus with peripheral neuropathy (HCC)   Vertigo  Past Surgical History: Past Surgical History: Procedure Laterality Date  COLONOSCOPY    ESOPHAGOGASTRODUODENOSCOPY (EGD) WITH PROPOFOL N/A 12/14/2019  Procedure: ESOPHAGOGASTRODUODENOSCOPY (EGD) WITH PROPOFOL;  Surgeon: Toledo, Benay Pike, MD;  Location: [facility] ENDOSCOPY;  Service: Gastroenterology;  Laterality: N/A;  FLEXIBLE BRONCHOSCOPY W/ UPPER ENDOSCOPY   HPI: Per H&P "Arjay Jaskiewicz is a 71 y.o. male with medical history significant for paroxysmal A-fib not on anticoagulation due to history of GI bleed, nicotine dependence, e-cigarette use, type 2 diabetes mellitus with peripheral neuropathy, hypertension, history of squamous cell carcinoma of the midesophagus initially  diagnosed in April, 2021.  Patient completed neoadjuvant chemoradiation therapy in June, 2021.  He was diagnosed with locally recurrent disease in March, 2022 and has pulmonary nodules suspicious for metastatic disease.  He is currently on single agent  therapy with pembrolizumab which he gets every 3 weeks.  Patient has worsening dysphagia and according to the wife he is only able to tolerate liquid diet at this time.  Patient's wife also states that he was recently treated for aspiration pneumonia with Augmentin and azithromycin and has completed antibiotic therapy.  Patient's wife called EMS earlier this morning because of worsening shortness of breath.  Per EMS patient had room air pulse oximetry of 82% and was placed on CPAP with improvement in his pulse oximetry to 93%.  He was noted to be in rapid A-fib with heart rate between 89 and 171.  He has a cough productive of beige phlegm but denies having any fever or chills.  He has no nausea, no vomiting, no dizziness, no lightheadedness, no leg swelling, no headache, no blurred vision no focal deficit.  Labs show an initial lactate of 4.5 which shows a downward trend as well as marked leukocytosis.  Chest x-ray shows bilateral nodular opacities which may reflect underlying malignancy as well as diffuse reticulonodular opacities throughout the right lung and left lower lobe which may reflect superimposed inflammation or infection.  Patient received aggressive IV fluid resuscitation and antibiotic therapy and is currently on a Cardizem drip.  He will be admitted to the hospital for further evaluation."  Subjective: "let's get this over with"  Recommendations for follow up therapy are one component of a multi-disciplinary discharge planning process, led by the attending physician.  Recommendations may be updated based on patient status, additional functional criteria and insurance authorization. Assessment / Plan / Recommendation   03/25/2022   4:00 PM Clinical  Impressions Clinical Impression Pt presents with moderate oropharyngeal dysphagia that results in silent aspiration and places pt at a high risk of further compromise. Pt's oral phase is c/b difficulty with postior propulsion of thin liquids via spoon, via cup and straw as well as nectar thick liquids via spoon and cup. Decreased bolus cohesion results in premature spillage of all boluses and piecemeal swallows (~ 3 swallow per bolus). Pt's pharyngeal phase is c/b delayed swallow initiation with boluses resting in the pyriform sinuses before swallow is initiated. This results in consistent penetration during the swallow that accumulates and results in silent aspiration. Pt also presents with moderate pharyngeal residue in the vallecula and pyriform sinuses that appears to be aspirated with fluro is turned back on. At this time, pt is at a very high risk of aspirating when consuming thin liquids and nectar thick liquids. Will defer to Palliative Care to engage with pt and his regarding Hand. SLP Visit Diagnosis Dysphagia, oropharyngeal phase (R13.12) Impact on safety and function Moderate aspiration risk;Risk for inadequate nutrition/hydration     03/25/2022   4:00 PM Treatment Recommendations Treatment Recommendations No treatment recommended at this time     03/25/2022   4:00 PM Prognosis Prognosis for Safe Diet Advancement Guarded Barriers to Reach Goals Severity of deficits;Time post onset   03/25/2022   4:00 PM Diet Recommendations SLP Diet Recommendations NPO Medication Administration Via alternative means     03/25/2022   4:00 PM Other Recommendations Oral Care Recommendations Oral care QID Follow Up Recommendations No SLP follow up Functional Status Assessment Patient has not had a recent decline in their functional status    No data to display        03/25/2022   4:00 PM Oral Phase Oral Phase Impaired Oral - Nectar Teaspoon Weak lingual manipulation;Reduced posterior propulsion;Holding of bolus;Piecemeal  swallowing;Delayed oral transit;Decreased bolus cohesion;Premature spillage;Lingual/palatal  residue Oral - Nectar Cup Weak lingual manipulation;Reduced posterior propulsion;Holding of bolus;Piecemeal swallowing;Delayed oral transit;Decreased bolus cohesion;Premature spillage;Lingual/palatal residue Oral - Thin Teaspoon Weak lingual manipulation;Reduced posterior propulsion;Holding of bolus;Lingual/palatal residue;Piecemeal swallowing;Delayed oral transit;Decreased bolus cohesion;Premature spillage Oral - Thin Cup Weak lingual manipulation;Reduced posterior propulsion;Holding of bolus;Lingual/palatal residue;Piecemeal swallowing;Delayed oral transit;Decreased bolus cohesion;Premature spillage Oral - Thin Straw Weak lingual manipulation;Reduced posterior propulsion;Holding of bolus;Lingual/palatal residue;Piecemeal swallowing;Delayed oral transit;Decreased bolus cohesion;Premature spillage    03/25/2022   4:00 PM Pharyngeal Phase Pharyngeal Phase Impaired Pharyngeal- Nectar Teaspoon Delayed swallow initiation-pyriform sinuses;Reduced pharyngeal peristalsis;Reduced epiglottic inversion;Reduced airway/laryngeal closure;Penetration/Aspiration during swallow;Pharyngeal residue - valleculae;Pharyngeal residue - pyriform Pharyngeal Material enters airway, passes BELOW cords without attempt by patient to eject out (silent aspiration) Pharyngeal- Nectar Cup Delayed swallow initiation-pyriform sinuses;Reduced pharyngeal peristalsis;Reduced epiglottic inversion;Reduced airway/laryngeal closure;Penetration/Apiration after swallow;Penetration/Aspiration during swallow;Pharyngeal residue - valleculae;Pharyngeal residue - pyriform Pharyngeal Material enters airway, passes BELOW cords without attempt by patient to eject out (silent aspiration) Pharyngeal- Thin Teaspoon Delayed swallow initiation-pyriform sinuses;Reduced pharyngeal peristalsis;Reduced epiglottic inversion;Reduced airway/laryngeal closure;Penetration/Aspiration during  swallow;Penetration/Apiration after swallow;Pharyngeal residue - valleculae;Pharyngeal residue - pyriform Pharyngeal Material enters airway, passes BELOW cords without attempt by patient to eject out (silent aspiration) Pharyngeal- Thin Cup Delayed swallow initiation-pyriform sinuses;Reduced pharyngeal peristalsis;Reduced epiglottic inversion;Reduced airway/laryngeal closure;Penetration/Aspiration during swallow;Penetration/Apiration after swallow;Pharyngeal residue - valleculae;Pharyngeal residue - pyriform Pharyngeal Material enters airway, passes BELOW cords without attempt by patient to eject out (silent aspiration) Pharyngeal- Thin Straw Delayed swallow initiation-pyriform sinuses;Reduced pharyngeal peristalsis;Reduced epiglottic inversion;Reduced airway/laryngeal closure;Penetration/Aspiration during swallow;Penetration/Apiration after swallow;Pharyngeal residue - valleculae;Pharyngeal residue - pyriform Pharyngeal Material enters airway, passes BELOW cords without attempt by patient to eject out (silent aspiration)    03/25/2022   4:00 PM Cervical Esophageal Phase  Cervical Esophageal Phase [facility] Happi Overton 03/25/2022, 4:27 PM                     DG Chest Port 1 View  Result Date: 03/22/2022 CLINICAL DATA:  Concern for sepsis. Aspiration pneumonia. History of esophageal carcinoma. EXAM: PORTABLE CHEST 1 VIEW COMPARISON:  None Available. FINDINGS: Heart size appears normal. No pleural effusion identified. Pleuroparenchymal scarring identified within the lateral right lung base. Signs of bilateral lower lobe bronchiectasis. Reticular and nodular opacities are identified throughout the right lung and left lower lobe. Bilateral nodular opacities are identified within the periphery of the right upper lobe, left and left lower lobe. Within the right base there is a focal irregular opacity with surrounding architectural distortion. The visualized osseous structures appear unremarkable. IMPRESSION: 1. Bilateral  nodular opacities identified within the right lung and left lower lobe. These are indeterminate. Findings may reflect areas of multifocal pneumonia. Underlying malignancy would be difficult to exclude in this patient who has a history esophageal carcinoma. Consider more definitive characterization with CT of the chest. 2. Diffuse reticulonodular opacities are noted throughout the right lung and left lower lobe which may reflect superimposed inflammation or infection. Electronically Signed   By: Kerby Moors M.D.   On: 03/22/2022 07:33    Microbiology: Results for orders placed or performed during the hospital encounter of 03/22/22  Blood Culture (routine x 2)     Status: None (Preliminary result)   Collection Time: 03/22/22  6:51 AM   Specimen: BLOOD  Result Value Ref Range Status   Specimen Description BLOOD BLOOD LEFT FOREARM  Final   Special Requests   Final    BOTTLES DRAWN AEROBIC AND ANAEROBIC Blood Culture results may not be optimal due to an inadequate volume of blood received  in culture bottles   Culture   Final    NO GROWTH 4 DAYS Performed at [facility], [address] [place], [place] [address]    Report Status PENDING  Incomplete  Blood Culture (routine x 2)     Status: None (Preliminary result)   Collection Time: 03/22/22  6:52 AM   Specimen: BLOOD  Result Value Ref Range Status   Specimen Description BLOOD BLOOD RIGHT HAND  Final   Special Requests   Final    BOTTLES DRAWN AEROBIC AND ANAEROBIC Blood Culture adequate volume   Culture   Final    NO GROWTH 4 DAYS Performed at [facility], [address] [place], [place] [address]    Report Status PENDING  Incomplete  SARS Coronavirus 2 by RT PCR (hospital order, performed in [place] hospital lab) *cepheid single result test* Anterior Nasal Swab     Status: None   Collection Time: 03/22/22  6:54 AM   Specimen: Anterior Nasal Swab  Result Value Ref Range Status   SARS Coronavirus 2 by RT PCR  NEGATIVE NEGATIVE Final    Comment: (NOTE) SARS-CoV-2 target nucleic acids are NOT DETECTED.  The SARS-CoV-2 RNA is generally detectable in upper and lower respiratory specimens during the acute phase of infection. The lowest concentration of SARS-CoV-2 viral copies this assay can detect is 250 copies / mL. A negative result does not preclude SARS-CoV-2 infection and should not be used as the sole basis for treatment or other patient management decisions.  A negative result may occur with improper specimen collection / handling, submission of specimen other than nasopharyngeal swab, presence of viral mutation(s) within the areas targeted by this assay, and inadequate number of viral copies (<250 copies / mL). A negative result must be combined with clinical observations, patient history, and epidemiological information.  Fact Sheet for Patients:   https://www.patel.info/  Fact Sheet for Healthcare Providers: https://hall.com/  This test is not yet approved or  cleared by the [place] FDA and has been authorized for detection and/or diagnosis of SARS-CoV-2 by FDA under an Emergency Use Authorization (EUA).  This EUA will remain in effect (meaning this test can be used) for the duration of the COVID-19 declaration under Section 564(b)(1) of the Act, 21 U.S.C. section 360bbb-3(b)(1), unless the authorization is terminated or revoked sooner.  Performed at [facility], [address] [place], [place] [address]   Respiratory (~20 pathogens) panel by PCR     Status: None   Collection Time: 03/22/22  5:35 PM   Specimen: Nasopharyngeal Swab; Respiratory  Result Value Ref Range Status   Adenovirus NOT DETECTED NOT DETECTED Final   Coronavirus 229E NOT DETECTED NOT DETECTED Final    Comment: (NOTE) The Coronavirus on the Respiratory Panel, DOES NOT test for the novel  Coronavirus (2019 nCoV)    Coronavirus HKU1 NOT DETECTED  NOT DETECTED Final   Coronavirus NL63 NOT DETECTED NOT DETECTED Final   Coronavirus OC43 NOT DETECTED NOT DETECTED Final   Metapneumovirus NOT DETECTED NOT DETECTED Final   Rhinovirus / Enterovirus NOT DETECTED NOT DETECTED Final   Influenza A NOT DETECTED NOT DETECTED Final   Influenza B NOT DETECTED NOT DETECTED Final   Parainfluenza Virus 1 NOT DETECTED NOT DETECTED Final   Parainfluenza Virus 2 NOT DETECTED NOT DETECTED Final   Parainfluenza Virus 3 NOT DETECTED NOT DETECTED Final   Parainfluenza Virus 4 NOT DETECTED NOT DETECTED Final   Respiratory Syncytial Virus NOT DETECTED NOT DETECTED  Final   Bordetella pertussis NOT DETECTED NOT DETECTED Final   Bordetella Parapertussis NOT DETECTED NOT DETECTED Final   Chlamydophila pneumoniae NOT DETECTED NOT DETECTED Final   Mycoplasma pneumoniae NOT DETECTED NOT DETECTED Final    Comment: Performed at [facility], [place] [address], [place], [place] [address]  MRSA Next Gen by PCR, Nasal     Status: None   Collection Time: 03/22/22  8:36 PM   Specimen: Nasal Mucosa; Nasal Swab  Result Value Ref Range Status   MRSA by PCR Next Gen NOT DETECTED NOT DETECTED Final    Comment: (NOTE) The GeneXpert MRSA Assay (FDA approved for NASAL specimens only), is one component of a comprehensive MRSA colonization surveillance program. It is not intended to diagnose MRSA infection nor to guide or monitor treatment for MRSA infections. Test performance is not FDA approved in patients less than 51 years old. Performed at [facility], [address], [place], [place] [address]   Urine Culture     Status: None   Collection Time: 03/25/22  6:38 AM   Specimen: Urine, Clean Catch  Result Value Ref Range Status   Specimen Description   Final    URINE, CLEAN CATCH Performed at [facility], [address], [place], [place] [address]    Special Requests   Final    NONE Performed at [facility], [address],  [place], [place] [address]    Culture   Final    NO GROWTH Performed at [facility], [place] [address], [place], [place] [address]    Report Status 03/26/2022 FINAL  Final    Labs: CBC: Recent Labs  Lab 03/22/22 0651 03/23/22 0420 03/24/22 0353 03/25/22 0515  WBC 18.5* 21.9* 18.6* 19.3*  NEUTROABS 17.2*  --  18.0* 18.5*  HGB 12.5* 9.1* 9.1* 9.0*  HCT 40.1 28.1* 27.8* 27.8*  MCV 87.6 84.9 85.8 84.0  PLT 594* 384 346 657   Basic Metabolic Panel: Recent Labs  Lab 03/22/22 0651 03/23/22 0420 03/24/22 0353 03/25/22 0515 03/26/22 0512  NA 133* 137 133* 131*  --   K 3.5 3.4* 4.2 4.1  --   CL 100 107 106 105  --   CO2 19* 23 19* 19*  --   GLUCOSE 258* 106* 193* 121*  --   BUN '22 22 23 15  ' --   CREATININE 0.79 0.62 0.66 0.42*  --   CALCIUM 9.9 9.0 9.1 8.9  --   MG 1.4* 1.7 1.8 1.8 1.7  PHOS  --  2.1* 2.3* 2.1* 3.3   Liver Function Tests: Recent Labs  Lab 03/22/22 0651  AST 57*  ALT 68*  ALKPHOS 55  BILITOT 0.6  PROT 8.7*  ALBUMIN 3.4*   CBG: Recent Labs  Lab 03/25/22 2100 03/25/22 2357 03/26/22 0412 03/26/22 0737 03/26/22 1124  GLUCAP 176* 179* 174* 163* 201*    Discharge time spent: greater than 30 minutes.  This record has been created using Systems analyst. Errors have been sought and corrected,but may not always be located. Such creation errors do not reflect on the standard of care.   Signed: Lorella Nimrod, MD [facility] 03/26/2022

## 2022-03-26 NOTE — Progress Notes (Signed)
Palliative: [name], Richard Adams, is lying quietly in bed.  He appears acutely/chronically ill and very frail.  He is sleepy during our conversation but able to participate.  He is oriented to person, place, situation, but is unsure of the month.  I believe that he can make his basic needs known.  His wife of 30+ years, Richard Adams, is present at bedside.  Speech therapist, Richard Adams is present also.  ST Richard Adams shares the results of yesterday's MBBS in detail with Richard Adams and Richard Adams.  She talks in detail about reviewed images and dysphagia, recommendations for NPO.  Both Richard Adams and Richard Adams want Richard Adams to be able to take food by mouth.  We talk about recommendations and risks and benefits of by mouth nutrition.  [place] conference for another meeting.  Richard Adams, and I continue to talk about the plan of care for Richard Adams.   Richard Adams shares that they have a scheduled appointment tomorrow with [facility] outpatient palliative provider through [facility].  Richard Adams states that she has been in contact with them, and they would be able to prescribe medications as needed.    We talk about finding a balance with treatment and quality of life.  I shared that Richard Adams continues to remain acutely ill, but is understandable that he would want to leave.  Richard Adams voices her concern that he continues to weaken while in the hospital.  We talk about Richard Adams's weakness and frailty.  I asked Richard Adams if it would surprise him if he were unable to recover, he shares that he would not be surprised.  Richard Adams shares that he should continue to try.   I suggest that in his weakened state, his oncologist may say he is no longer strong enough to take Young.  Richard Adams states that he wants to continue to take Richard Adams.  I shared that a person must be strong enough to take cancer therapies, and things may be looking different now.  I shared that if he is no longer offered Keytruda, he would greatly benefit from hospice care.  Throughout our conversation Richard Adams  continues to ask appropriate questions such as an burdening Richard Adams from vitamins at this time, only focusing on medications that are necessary.  Conference with attending, bedside nursing staff, speech therapy, transition of care team related to patient condition, needs, goals of care, disposition.  Plan:   Richard Adams and his wife are requesting discharge today.  They shared that they will follow-up with their Richard Adams palliative provider tomorrow during a scheduled appointment.   Continue full scope/full code at this time.  56 minutes [name], NP Palliative medicine team Team phone [phone] Greater than 50% of this time was spent counseling and coordinating care related to the above assessment and plan.

## 2022-03-26 NOTE — Progress Notes (Signed)
SATURATION QUALIFICATIONS: (This note is used to comply with regulatory documentation for home oxygen)  Patient Saturations on Room Air at Rest = 88  Patient Saturations on Room Air while Ambulating = 86%  Patient Saturations on 4 Liters of oxygen while Ambulating = 95%  Please briefly explain why patient needs home oxygen: Pt sats drop with out 02

## 2022-03-26 NOTE — Progress Notes (Signed)
4L O2 ordered via Rhonda with [employer]. No further dc needs  [place], [place]

## 2022-03-26 NOTE — Care Management Important Message (Signed)
Important Message  Patient Details  Name: Richard Adams MRN: [id number] Date of Birth: 1951-05-17   Medicare Important Message Given:  Yes     Dannette Barbara 03/26/2022, 11:25 AM

## 2022-03-27 LAB — CULTURE, BLOOD (ROUTINE X 2)
Culture: NO GROWTH
Culture: NO GROWTH
Special Requests: ADEQUATE

## 2022-03-31 ENCOUNTER — Other Ambulatory Visit: Payer: Self-pay

## 2022-03-31 ENCOUNTER — Inpatient Hospital Stay: Payer: Medicare Other

## 2022-03-31 ENCOUNTER — Emergency Department: Payer: Medicare Other

## 2022-03-31 ENCOUNTER — Inpatient Hospital Stay
Admission: EM | Admit: 2022-03-31 | Discharge: 2022-04-02 | DRG: 177 | Disposition: A | Discharge status: Hospice | Payer: Medicare Other | Attending: Internal Medicine | Admitting: Internal Medicine

## 2022-03-31 DIAGNOSIS — E785 Hyperlipidemia, unspecified: Secondary | ICD-10-CM | POA: Diagnosis present

## 2022-03-31 DIAGNOSIS — I480 Paroxysmal atrial fibrillation: Secondary | ICD-10-CM | POA: Diagnosis present

## 2022-03-31 DIAGNOSIS — M549 Dorsalgia, unspecified: Secondary | ICD-10-CM | POA: Diagnosis present

## 2022-03-31 DIAGNOSIS — E43 Unspecified severe protein-calorie malnutrition: Secondary | ICD-10-CM | POA: Diagnosis present

## 2022-03-31 DIAGNOSIS — F17210 Nicotine dependence, cigarettes, uncomplicated: Secondary | ICD-10-CM | POA: Diagnosis present

## 2022-03-31 DIAGNOSIS — R0603 Acute respiratory distress: Secondary | ICD-10-CM

## 2022-03-31 DIAGNOSIS — Z681 Body mass index (BMI) 19 or less, adult: Secondary | ICD-10-CM | POA: Diagnosis not present

## 2022-03-31 DIAGNOSIS — Z91030 Bee allergy status: Secondary | ICD-10-CM | POA: Diagnosis not present

## 2022-03-31 DIAGNOSIS — K219 Gastro-esophageal reflux disease without esophagitis: Secondary | ICD-10-CM | POA: Diagnosis present

## 2022-03-31 DIAGNOSIS — J9601 Acute respiratory failure with hypoxia: Secondary | ICD-10-CM | POA: Diagnosis present

## 2022-03-31 DIAGNOSIS — J860 Pyothorax with fistula: Secondary | ICD-10-CM | POA: Diagnosis present

## 2022-03-31 DIAGNOSIS — Z808 Family history of malignant neoplasm of other organs or systems: Secondary | ICD-10-CM

## 2022-03-31 DIAGNOSIS — E1142 Type 2 diabetes mellitus with diabetic polyneuropathy: Secondary | ICD-10-CM | POA: Diagnosis present

## 2022-03-31 DIAGNOSIS — J449 Chronic obstructive pulmonary disease, unspecified: Secondary | ICD-10-CM | POA: Diagnosis present

## 2022-03-31 DIAGNOSIS — Z515 Encounter for palliative care: Secondary | ICD-10-CM | POA: Diagnosis not present

## 2022-03-31 DIAGNOSIS — Z79899 Other long term (current) drug therapy: Secondary | ICD-10-CM | POA: Diagnosis not present

## 2022-03-31 DIAGNOSIS — G8929 Other chronic pain: Secondary | ICD-10-CM | POA: Diagnosis present

## 2022-03-31 DIAGNOSIS — J690 Pneumonitis due to inhalation of food and vomit: Principal | ICD-10-CM | POA: Diagnosis present

## 2022-03-31 DIAGNOSIS — Z7984 Long term (current) use of oral hypoglycemic drugs: Secondary | ICD-10-CM

## 2022-03-31 DIAGNOSIS — I10 Essential (primary) hypertension: Secondary | ICD-10-CM | POA: Diagnosis present

## 2022-03-31 DIAGNOSIS — Z888 Allergy status to other drugs, medicaments and biological substances status: Secondary | ICD-10-CM | POA: Diagnosis not present

## 2022-03-31 DIAGNOSIS — R0602 Shortness of breath: Principal | ICD-10-CM

## 2022-03-31 DIAGNOSIS — T17410A Gastric contents in trachea causing asphyxiation, initial encounter: Secondary | ICD-10-CM | POA: Diagnosis present

## 2022-03-31 DIAGNOSIS — D72829 Elevated white blood cell count, unspecified: Secondary | ICD-10-CM | POA: Diagnosis present

## 2022-03-31 DIAGNOSIS — C159 Malignant neoplasm of esophagus, unspecified: Secondary | ICD-10-CM | POA: Diagnosis present

## 2022-03-31 DIAGNOSIS — Z7189 Other specified counseling: Secondary | ICD-10-CM | POA: Diagnosis not present

## 2022-03-31 DIAGNOSIS — F17200 Nicotine dependence, unspecified, uncomplicated: Secondary | ICD-10-CM | POA: Diagnosis present

## 2022-03-31 DIAGNOSIS — Z7951 Long term (current) use of inhaled steroids: Secondary | ICD-10-CM

## 2022-03-31 DIAGNOSIS — Z66 Do not resuscitate: Secondary | ICD-10-CM | POA: Diagnosis present

## 2022-03-31 LAB — BASIC METABOLIC PANEL
Anion gap: 12 (ref 5–15)
BUN: 19 mg/dL (ref 8–23)
CO2: 18 mmol/L — ABNORMAL LOW (ref 22–32)
Calcium: 8.8 mg/dL — ABNORMAL LOW (ref 8.9–10.3)
Chloride: 103 mmol/L (ref 98–111)
Creatinine, Ser: 0.74 mg/dL (ref 0.61–1.24)
GFR, Estimated: >60 mL/min (ref >60)
Glucose, Bld: 213 mg/dL — ABNORMAL HIGH (ref 70–99)
Potassium: 3.3 mmol/L — ABNORMAL LOW (ref 3.5–5.1)
Sodium: 133 mmol/L — ABNORMAL LOW (ref 135–145)

## 2022-03-31 LAB — BLOOD GAS, ARTERIAL
Acid-base deficit: 7 mmol/L — ABNORMAL HIGH (ref 0.0–2.0)
Bicarbonate: 15.3 mmol/L — ABNORMAL LOW (ref 20.0–28.0)
Delivery systems: POSITIVE
Expiratory PAP: 6 cmH2O
FIO2: 100 %
Inspiratory PAP: 12 cmH2O
O2 Saturation: 99.6 %
Patient temperature: 37
pCO2 arterial: 22 mmHg — ABNORMAL LOW (ref 32–48)
pH, Arterial: 7.45 (ref 7.35–7.45)
pO2, Arterial: 188 mmHg — ABNORMAL HIGH (ref 83–108)

## 2022-03-31 LAB — CBC WITH DIFFERENTIAL/PLATELET
Abs Immature Granulocytes: 0.05 10*3/uL (ref 0.00–0.07)
Basophils Absolute: 0 10*3/uL (ref 0.0–0.1)
Basophils Relative: 0 %
Eosinophils Absolute: 0 10*3/uL (ref 0.0–0.5)
Eosinophils Relative: 0 %
HCT: 39.4 % (ref 39.0–52.0)
Hemoglobin: 12.3 g/dL — ABNORMAL LOW (ref 13.0–17.0)
Immature Granulocytes: 0 %
Lymphocytes Relative: 3 %
Lymphs Abs: 0.4 10*3/uL — ABNORMAL LOW (ref 0.7–4.0)
MCH: 26.9 pg (ref 26.0–34.0)
MCHC: 31.2 g/dL (ref 30.0–36.0)
MCV: 86.2 fL (ref 80.0–100.0)
Monocytes Absolute: 0.1 10*3/uL (ref 0.1–1.0)
Monocytes Relative: 1 %
Neutro Abs: 12.1 10*3/uL — ABNORMAL HIGH (ref 1.7–7.7)
Neutrophils Relative %: 96 %
Platelets: 472 10*3/uL — ABNORMAL HIGH (ref 150–400)
RBC: 4.57 MIL/uL (ref 4.22–5.81)
RDW: 16.9 % — ABNORMAL HIGH (ref 11.5–15.5)
WBC: 12.7 10*3/uL — ABNORMAL HIGH (ref 4.0–10.5)
nRBC: 0 % (ref 0.0–0.2)

## 2022-03-31 LAB — TSH: TSH: 1.945 u[IU]/mL (ref 0.350–4.500)

## 2022-03-31 LAB — MRSA NEXT GEN BY PCR, NASAL: MRSA by PCR Next Gen: NOT DETECTED

## 2022-03-31 LAB — TROPONIN I (HIGH SENSITIVITY)
Troponin I (High Sensitivity): 9 ng/L (ref <18)
Troponin I (High Sensitivity): 13 ng/L (ref <18)

## 2022-03-31 LAB — D-DIMER, QUANTITATIVE: D-Dimer, Quant: 1.61 ug/mL-FEU — ABNORMAL HIGH (ref 0.00–0.50)

## 2022-03-31 MED ORDER — ONDANSETRON HCL 4 MG/2ML IJ SOLN: 4.0000 mg | Freq: Four times a day (QID) | INTRAMUSCULAR | Status: DC | PRN

## 2022-03-31 MED ORDER — IPRATROPIUM-ALBUTEROL 0.5-2.5 (3) MG/3ML IN SOLN
3.0000 mL | Freq: Once | RESPIRATORY_TRACT | Status: AC
  Administered 2022-03-31: 3 mL via RESPIRATORY_TRACT

## 2022-03-31 MED ORDER — VANCOMYCIN HCL IN DEXTROSE 1-5 GM/200ML-% IV SOLN
1000.0000 mg | Freq: Once | INTRAVENOUS | Status: AC
  Administered 2022-03-31: 1000 mg via INTRAVENOUS
  Filled 2022-03-31: qty 200

## 2022-03-31 MED ORDER — ORAL CARE MOUTH RINSE
15.0000 mL | OROMUCOSAL | Status: DC
  Administered 2022-03-31 – 2022-04-02 (×7): 15 mL via OROMUCOSAL

## 2022-03-31 MED ORDER — IPRATROPIUM-ALBUTEROL 0.5-2.5 (3) MG/3ML IN SOLN
3.0000 mL | Freq: Once | RESPIRATORY_TRACT | Status: DC
  Filled 2022-03-31: qty 3

## 2022-03-31 MED ORDER — FENTANYL 25 MCG/HR TD PT72
1.0000 | MEDICATED_PATCH | TRANSDERMAL | Status: DC
  Administered 2022-03-31: 1 via TRANSDERMAL
  Filled 2022-03-31: qty 1

## 2022-03-31 MED ORDER — LACTATED RINGERS IV BOLUS
1000.0000 mL | Freq: Once | INTRAVENOUS | Status: AC
  Administered 2022-03-31: 1000 mL via INTRAVENOUS

## 2022-03-31 MED ORDER — ORAL CARE MOUTH RINSE
15.0000 mL | OROMUCOSAL | Status: DC | PRN
Start: 2022-03-31 — End: 2022-04-03

## 2022-03-31 MED ORDER — IPRATROPIUM-ALBUTEROL 0.5-2.5 (3) MG/3ML IN SOLN
3.0000 mL | Freq: Four times a day (QID) | RESPIRATORY_TRACT | Status: DC | PRN
Start: 2022-03-31 — End: 2022-04-03

## 2022-03-31 MED ORDER — SENNOSIDES-DOCUSATE SODIUM 8.6-50 MG PO TABS: 1.0000 | ORAL_TABLET | Freq: Every evening | ORAL | Status: DC | PRN

## 2022-03-31 MED ORDER — SODIUM CHLORIDE 0.9 % IV SOLN
3.0000 g | Freq: Four times a day (QID) | INTRAVENOUS | Status: DC
  Administered 2022-03-31 – 2022-04-02 (×8): 3 g via INTRAVENOUS
  Filled 2022-03-31: qty 8
  Filled 2022-03-31 (×5): qty 3
  Filled 2022-03-31: qty 8
  Filled 2022-03-31: qty 3
  Filled 2022-03-31 (×2): qty 8

## 2022-03-31 MED ORDER — ALBUTEROL SULFATE (2.5 MG/3ML) 0.083% IN NEBU: 2.5000 mg | INHALATION_SOLUTION | Freq: Four times a day (QID) | RESPIRATORY_TRACT | Status: DC | PRN

## 2022-03-31 MED ORDER — ACETAMINOPHEN 650 MG RE SUPP: 650.0000 mg | Freq: Four times a day (QID) | RECTAL | Status: DC | PRN

## 2022-03-31 MED ORDER — DILTIAZEM HCL 25 MG/5ML IV SOLN
10.0000 mg | Freq: Once | INTRAVENOUS | Status: AC
  Administered 2022-03-31: 10 mg via INTRAVENOUS
  Filled 2022-03-31: qty 5

## 2022-03-31 MED ORDER — SODIUM CHLORIDE 0.9 % IV SOLN
2.0000 g | Freq: Once | INTRAVENOUS | Status: AC
  Administered 2022-03-31: 2 g via INTRAVENOUS
  Filled 2022-03-31: qty 12.5

## 2022-03-31 MED ORDER — DIPHENHYDRAMINE HCL 50 MG/ML IJ SOLN
12.5000 mg | Freq: Once | INTRAMUSCULAR | Status: AC
  Administered 2022-03-31: 12.5 mg via INTRAVENOUS
  Filled 2022-03-31: qty 1

## 2022-03-31 MED ORDER — ACETAMINOPHEN 325 MG PO TABS
650.0000 mg | ORAL_TABLET | Freq: Four times a day (QID) | ORAL | Status: DC | PRN
  Filled 2022-03-31: qty 2

## 2022-03-31 MED ORDER — PANTOPRAZOLE SODIUM 40 MG IV SOLR: 40.0000 mg | Freq: Every day | INTRAVENOUS | Status: DC | PRN

## 2022-03-31 MED ORDER — ENOXAPARIN SODIUM 40 MG/0.4ML IJ SOSY
40.0000 mg | PREFILLED_SYRINGE | Freq: Every day | INTRAMUSCULAR | Status: DC
  Administered 2022-03-31 – 2022-04-01 (×2): 40 mg via SUBCUTANEOUS
  Filled 2022-03-31 (×2): qty 0.4

## 2022-03-31 MED ORDER — IOHEXOL 350 MG/ML SOLN
75.0000 mL | Freq: Once | INTRAVENOUS | Status: AC | PRN
  Administered 2022-03-31: 75 mL via INTRAVENOUS

## 2022-03-31 MED ORDER — KETOROLAC TROMETHAMINE 15 MG/ML IJ SOLN
15.0000 mg | Freq: Once | INTRAMUSCULAR | Status: AC
  Administered 2022-03-31: 15 mg via INTRAVENOUS
  Filled 2022-03-31: qty 1

## 2022-03-31 MED ORDER — NICOTINE 21 MG/24HR TD PT24
21.0000 mg | MEDICATED_PATCH | Freq: Every day | TRANSDERMAL | Status: DC | PRN
  Administered 2022-03-31: 21 mg via TRANSDERMAL
  Filled 2022-03-31: qty 1

## 2022-03-31 MED ORDER — CHLORHEXIDINE GLUCONATE CLOTH 2 % EX PADS
6.0000 | MEDICATED_PAD | Freq: Every day | CUTANEOUS | Status: DC
  Administered 2022-03-31: 6 via TOPICAL

## 2022-03-31 MED ORDER — CHLORHEXIDINE GLUCONATE CLOTH 2 % EX PADS
6.0000 | MEDICATED_PAD | Freq: Every day | CUTANEOUS | Status: DC
  Administered 2022-04-01 – 2022-04-02 (×2): 6 via TOPICAL

## 2022-03-31 MED ORDER — MIDODRINE HCL 5 MG PO TABS
10.0000 mg | ORAL_TABLET | Freq: Once | ORAL | Status: AC
  Administered 2022-03-31: 10 mg via ORAL
  Filled 2022-03-31: qty 2

## 2022-03-31 MED ORDER — ONDANSETRON HCL 4 MG PO TABS: 4.0000 mg | ORAL_TABLET | Freq: Four times a day (QID) | ORAL | Status: DC | PRN

## 2022-03-31 MED ORDER — IPRATROPIUM-ALBUTEROL 0.5-2.5 (3) MG/3ML IN SOLN: 3.0000 mL | Freq: Once | RESPIRATORY_TRACT | Status: DC

## 2022-03-31 MED ORDER — UMECLIDINIUM-VILANTEROL 62.5-25 MCG/ACT IN AEPB
2.0000 | INHALATION_SPRAY | Freq: Every day | RESPIRATORY_TRACT | Status: DC
  Administered 2022-04-01 – 2022-04-02 (×2): 2 via RESPIRATORY_TRACT
  Filled 2022-03-31: qty 14

## 2022-03-31 NOTE — ED Notes (Signed)
[place]  WITH  [facility]

## 2022-03-31 NOTE — Assessment & Plan Note (Addendum)
Maintain NPO until [place] finalized and patient started on hospice/comfort care.

## 2022-03-31 NOTE — Assessment & Plan Note (Signed)
-   DuoNebs every 6 hours as needed for wheezing and shortness of breath, 4 days ordered - Resumed home Anoro Ellipta, 2 puffs daily

## 2022-03-31 NOTE — Progress Notes (Signed)
       CROSS COVER NOTE  NAME: Richard Adams MRN: [id number] DOB : 09/04/1950    Date of Service   03/31/22  HPI/Events of Note   Medication request received for chronic back pain and sleep aid while NPO.  0240: [name] is reporting breakthrough pain despite fentanyl patch applied last night  Interventions   Plan: Toradol and Benadryl  '1mg'$  morphine      This document was prepared using Dragon voice recognition software and may include unintentional dictation errors.  Neomia Glass DNP, MHA, FNP-BC Nurse Practitioner [facility] Pager [phone]

## 2022-03-31 NOTE — Assessment & Plan Note (Addendum)
Home fentanyl patch resumed. IV morphine PRN

## 2022-03-31 NOTE — Consult Note (Signed)
Pharmacy Antibiotic Note  Richard Adams is a 71 y.o. male w/ h/o esophageal carcinoma, Afib, HTN, HLD, neuropathy, NIDDM, nicotine dependence presenting with CC of shortness of breath & admitted on 03/31/2022 after coughing event with c/f aspiration pneumonia.  Pharmacy has been consulted for Unasyn dosing.  Plan: Received VAN 1g IV x1 in ED (1300) & CFP 2g IV x1 in ED (1210) on 8/14 Will now initiate Unasyn 3g IV q6h 8/14 '@2100'$   Height: '6\' 1"'$  (185.4 cm) IBW/kg (Calculated) : 79.9  Temp (24hrs), Avg:97.9 F (36.6 C), Min:97.7 F (36.5 C), Max:98.1 F (36.7 C)  Recent Labs  Lab 03/25/22 0515 03/31/22 1143  WBC 19.3* 12.7*  CREATININE 0.42* 0.74    Estimated Creatinine Clearance: 70.2 mL/min (by C-G formula based on SCr of 0.74 mg/dL).    Allergies  Allergen Reactions   Succinylcholine Other (See Comments)    Paralyzing for 3 days after last dose   Wasp Venom Protein Anaphylaxis   Sertraline Hcl Other (See Comments)    Felt like it burned the throat. Felt different in bad way   Naltrexone     Antimicrobials this admission: VAN/CFP x1 (8/14) Unasyn (8/14 >>   Dose adjustments this admission: CTM and adjust PRN.   Microbiology results: 8/14 BCx: pending 8/14 RVP: pending  8/14 COVID: pending  8/14 MRSA PCR: pending  Thank you for allowing pharmacy to be a part of this patient's care.  Lorna Dibble 03/31/2022 7:46 PM

## 2022-03-31 NOTE — Assessment & Plan Note (Signed)
-   Nicotine patch as needed ordered ?

## 2022-03-31 NOTE — H&P (Addendum)
History and Physical   Richard Adams VFI:[id number] DOB: 03-Nov-1950 DOA: 03/31/2022  PCP: [name], PA-C  Outpatient Specialists: Dr. Reynaldo Minium, [facility] On[facility] Patient coming from: home via EMS  I have personally briefly reviewed patient's old medical records in [place].  Chief Concern: shortness of breath  HPI: Richard Adams is a 71 year old male with history of esophageal carcinoma, atrial fibrillation, hypertension, hyperlipidemia, neuropathy, non-insulin-dependent diabetes mellitus, hyperlipidemia, nicotine dependence, who presents to the emergency department for chief concerns of shortness of breath.  Initial vitals in the emergency department showed temperature of 97.9, respiration rate of 32 in improved to 23, initial heart rate 172 and improved to 99, blood pressure 101/68, SPO2 of 87% on CPAP currently on BiPAP with SPO2 of 92%.  Serum sodium is 133, potassium 3.3, chloride 108, bicarb 18, BUN of 19, serum creatinine of 0.74, GFR greater than 60, nonfasting blood glucose 213.  VBG 7.45/22/188  ED treatment diltiazem 10 mg IV one-time dose, DuoNebs one-time dose, cefepime 2 g IV, vancomycin, LR 1 L bolus.  Family requested [facility] transfer.  EDP called [facility] and spoke with patient's oncologist, Dr. Reynaldo Minium who has accepted the patient pending bed availability.  Per EDP, Dr. Reynaldo Minium expected bed availability within the next 24 hours. Per EDP, [facility] transfer center is requesting a covid/influenza A/Influenza B test prior to transfer. This has been ordered and pending collection.  At bedside patient was able to tell me his name, his age, the current calendar year.  He knows he is in the hospital.  He exhibits increased respiratory effort at bedside.  He reports that he has been eating at home specifically the last time he had soup, broccoli cheese soup on 03/30/2022.  He reports that he woke up feeling worsening shortness of breath.  Social history: He lives at  home with his wife.  He is a current tobacco user, last used tobacco about 1 week ago.  He denies EtOH and recreational drug use.  ROS: Constitutional: no weight change, no fever ENT/Mouth: no sore throat, no rhinorrhea Eyes: no eye pain, no vision changes Cardiovascular: no chest pain, + dyspnea,  no edema, no palpitations Respiratory: + cough, no sputum, no wheezing Gastrointestinal: no nausea, no vomiting, no diarrhea, no constipation Genitourinary: no urinary incontinence, no dysuria, no hematuria Musculoskeletal: no arthralgias, no myalgias Skin: no skin lesions, no pruritus, Neuro: + weakness, no loss of consciousness, no syncope Psych: no anxiety, no depression, no decrease appetite Heme/Lymph: no bruising, no bleeding  ED Course: Discussed with emergency medicine provider, patient requiring hospitalization for chief concerns of acute hypoxic respiratory failure requiring BiPAP.  Family was requesting to transfer.  EDP  Assessment/Plan  Principal Problem:   Acute hypoxemic respiratory failure (HCC) Active Problems:   Acute respiratory failure with hypoxia (HCC)   COPD (chronic obstructive pulmonary disease) (HCC)   PAF (paroxysmal atrial fibrillation) (HCC)   Tobacco use disorder   Hyperlipidemia   GERD (gastroesophageal reflux disease)   Chronic back pain   Leukocytosis   Tracheoesophageal fistula, acquired ([place])   Assessment and Plan:  * Acute hypoxemic respiratory failure ([place]) - Presumed multifactorial including recent aspiration pneumonia and patient continuing p.o. intake - Per patient and family he last had cheese broccoli soup on 03/30/2022 - I counseled patient and spouse at bedside regarding his risk for aspiration pneumonia given his esophageal carcinoma - Spouse reports that they are considering tube feeds - Home antibiotic medication converted to Unasyn IV - Maintain n.p.o. except for  sips with meds  COPD (chronic obstructive pulmonary disease) (HCC) -  DuoNebs every 6 hours as needed for wheezing and shortness of breath, 4 days ordered - Resumed home Anoro Ellipta, 2 puffs daily  Tobacco use disorder - Nicotine patch as needed ordered  Tracheoesophageal fistula, acquired ([place]) - Presumed secondary to esophageal malignancy - Patient to remain n.p.o. until further discussion regarding tube feeds  Chronic back pain - Home fentanyl 25 mcg every 72 hours resumed, with first dose on day of admission  Holding all p.o. home medications due to tracheoesophageal fistula A.m. team to continue discussion with patient regarding tube feeding.  Patient has been accepted at [facility] under oncology service, Dr. Reynaldo Minium.  Pending bed availability.  Chart reviewed.   DVT prophylaxis: Enoxaparin Code Status: Full code Diet: N.p.o. Family Communication: Updated wife at bedside Disposition Plan: Pending clinical course Consults called: None at this time Admission status: Stepdown, inpatient  Past Medical History:  Diagnosis Date   Alcohol dependence with intoxication ([place])    Alcohol induced insomnia ([place])    Atrial fibrillation with RVR ([place])    Chronic bronchitis (HCC)    COPD (chronic obstructive pulmonary disease) ([place])    Diabetes mellitus without complication ([place])    ED (erectile dysfunction)    GERD (gastroesophageal reflux disease)    Headache    History of subdural hematoma    Hypertension    Migraine    Mixed hyperlipidemia    PAF (paroxysmal atrial fibrillation) ([place])    Screening for malignant neoplasm of respiratory organ    SOB (shortness of breath)    Tobacco use disorder    Type 2 diabetes mellitus with peripheral neuropathy (HCC)    Vertigo    Past Surgical History:  Procedure Laterality Date   COLONOSCOPY     ESOPHAGOGASTRODUODENOSCOPY (EGD) WITH PROPOFOL N/A 12/14/2019   Procedure: ESOPHAGOGASTRODUODENOSCOPY (EGD) WITH PROPOFOL;  Surgeon: Toledo, Benay Pike, MD;  Location: [facility] ENDOSCOPY;  Service: Gastroenterology;   Laterality: N/A;   FLEXIBLE BRONCHOSCOPY W/ UPPER ENDOSCOPY     Social History:  reports that he has been smoking cigarettes. He has been smoking an average of 1 pack per day. He has never used smokeless tobacco. He reports current alcohol use. He reports that he does not use drugs.  Allergies  Allergen Reactions   Succinylcholine Other (See Comments)    Paralyzing for 3 days after last dose   Wasp Venom Protein Anaphylaxis   Sertraline Hcl Other (See Comments)    Felt like it burned the throat. Felt different in bad way   Naltrexone    Family History  Problem Relation Age of Onset   Throat cancer Mother    Family history: Family history reviewed and not pertinent.  Prior to Admission medications   Medication Sig Start Date End Date Taking? Authorizing Provider  albuterol (VENTOLIN HFA) 108 (90 Base) MCG/ACT inhaler Inhale 2 puffs into the lungs every 6 (six) hours as needed. 08/20/21 08/20/22 Yes [provider]  amoxicillin-clavulanate (AUGMENTIN) 250-62.5 MG/5ML suspension Take 5 mLs (250 mg total) by mouth 2 (two) times daily for 10 days. 03/26/22 04/05/22 Yes Lorella Nimrod, MD  benzonatate (TESSALON) 200 MG capsule Take 200 mg by mouth 3 (three) times daily as needed. 12/12/21  Yes [provider]  fentaNYL (DURAGESIC) 25 MCG/HR Place 1 patch onto the skin every 3 (three) days.   Yes [provider]  flecainide (TAMBOCOR) 100 MG tablet Take 100 mg by mouth 2 (two) times daily.  Yes [provider]  metFORMIN (GLUCOPHAGE) 500 MG tablet Take 500 mg by mouth 2 (two) times daily with a meal. 01/27/18 03/31/22 Yes [provider]  metoprolol succinate (TOPROL-XL) 25 MG 24 hr tablet Take 12.5 mg by mouth daily. 03/04/18 03/31/22 Yes [provider]  midodrine (PROAMATINE) 5 MG tablet Take 5 mg by mouth in the morning, at noon, and at bedtime. 03/13/22 03/13/23 Yes [provider]  pantoprazole (PROTONIX) 40 MG tablet Take 40 mg by mouth  daily.   Yes [provider]  acetaminophen (TYLENOL CHILDRENS) 160 MG/5ML suspension Take 20 mLs by mouth every 6 (six) hours as needed. 11/27/21   [provider]  amoxicillin-clavulanate (AUGMENTIN) 250-62.5 MG/5ML suspension Take 5 mLs (250 mg total) by mouth 2 (two) times daily for 10 days. 03/26/22 04/05/22  Lorella Nimrod, MD  DPH-Lido-AlHydr-MgHydr-Simeth (FIRST-MOUTHWASH BLM) SUSP Take 5 mLs by mouth in the morning and at bedtime. 03/13/22   [provider]  EPINEPHrine 0.3 mg/0.3 mL IJ SOAJ injection as needed. 06/11/18   [provider]  famotidine (PEPCID) 20 MG tablet Take by mouth as needed. Patient not taking: Reported on 03/22/2022 06/11/18   [provider]  folic acid (FOLVITE) 1 MG tablet Take 1 mg by mouth daily.    [provider]  gabapentin (NEURONTIN) 250 MG/5ML solution Take 12 mLs by mouth in the morning, at noon, and at bedtime. 08/12/21   [provider]  hydrocortisone 1 % ointment Apply 1 Application topically daily.    [provider]  Levothyroxine Sodium (TIROSINT-SOL) 137 MCG/ML SOLN Take 137 mcg by mouth in the morning. 11/22/21   [provider]  loperamide (IMODIUM) 2 MG capsule Take 2 mg by mouth as needed for diarrhea or loose stools.    [provider]  megestrol (MEGACE) 40 MG/ML suspension Take 10 mLs by mouth daily. 03/13/22 03/13/23  [provider]  miconazole (MICOTIN) 2 % cream Apply 1 Application topically 2 (two) times daily.    [provider]  morphine (MS CONTIN) 30 MG 12 hr tablet Take 1 tablet (30 mg total) by mouth every 8 (eight) hours as needed for up to 6 days. Patient not taking: Reported on 03/31/2022 03/26/22 04/01/22  Lorella Nimrod, MD  ondansetron (ZOFRAN) 8 MG tablet Take 8 mg by mouth every 8 (eight) hours as needed. 01/14/22   [provider]  oxyCODONE (OXY IR/ROXICODONE) 5 MG immediate release tablet Take 10 mg by mouth every 4 (four)  hours as needed. 03/06/22   [provider]  polyethylene glycol powder (GLYCOLAX/MIRALAX) 17 GM/SCOOP powder Take by mouth.    [provider]  rosuvastatin (CRESTOR) 5 MG tablet Take 5 mg by mouth 3 (three) times a week. 07/30/18   [provider]  tamsulosin (FLOMAX) 0.4 MG CAPS capsule Take 0.4 mg by mouth daily. Take one-half hour after the same meal each day 03/21/22   [provider]  temazepam (RESTORIL) 15 MG capsule Take 15 mg by mouth at bedtime as needed. 03/13/22 04-28-2022  [provider]  thiamine (VITAMIN B1) 100 MG tablet Take 100 mg by mouth daily. 10/22/21 10/22/22  [provider]  triamcinolone ointment (KENALOG) 0.1 % as needed. 11/20/17   [provider]  umeclidinium-vilanterol (ANORO ELLIPTA) 62.5-25 MCG/INH AEPB Inhale 1 puff into the lungs daily.    [provider]  Wheat Dextrin (BENEFIBER DRINK MIX PO) Take 1 Bottle by mouth daily as needed (FIBER).    [provider]   Physical Exam: Vitals:   03/31/22 1845 03/31/22 1900 03/31/22 1915 03/31/22 1930  BP: 102/78 99/71 102/70 91/68  Pulse:      Resp: (!) '26 20 19 18  '$ Temp:      TempSrc:      SpO2:      Height:       Constitutional: appears older than chronological age, frail, chronically ill, cachectic, NAD, calm, comfortable Eyes: PERRL, lids and conjunctivae normal ENMT: Mucous membranes are moist. Posterior pharynx clear of any exudate or lesions. Age-appropriate dentition. Hearing appropriate Neck: normal, supple, no masses, no thyromegaly Respiratory: Diffused lung sounds bilaterally, increased respiratory effort. Positive accessory muscle use.  Cardiovascular: Regular rate and rhythm, no murmurs / rubs / gallops. No extremity edema. 2+ pedal pulses. No carotid bruits.  Abdomen: no tenderness, no masses palpated, no hepatosplenomegaly. Bowel sounds positive.  Musculoskeletal: no clubbing / cyanosis. No joint deformity upper and lower  extremities. Good ROM, no contractures, no atrophy. Normal muscle tone.  Skin: no rashes, lesions, ulcers. No induration Neurologic: Sensation intact. Strength 5/5 in all 4.  Psychiatric: Normal judgment and insight. Alert and oriented x 3. Normal mood.   EKG: Ordered  Chest x-ray on Admission: I personally reviewed and I agree with radiologist reading as below.  CT Angio Chest Pulmonary Embolism (PE) W or WO Contrast  Result Date: 03/31/2022 CLINICAL DATA:  Pulmonary embolus suspected; * Tracking Code: BO * EXAM: CT ANGIOGRAPHY CHEST WITH CONTRAST TECHNIQUE: Multidetector CT imaging of the chest was performed using the standard protocol during bolus administration of intravenous contrast. Multiplanar CT image reconstructions and MIPs were obtained to evaluate the vascular anatomy. RADIATION DOSE REDUCTION: This exam was performed according to the departmental dose-optimization program which includes automated exposure control, adjustment of the mA and/or kV according to patient size and/or use of iterative reconstruction technique. CONTRAST:  88m OMNIPAQUE IOHEXOL 350 MG/ML SOLN COMPARISON:  None Available. FINDINGS: Cardiovascular: No evidence of pulmonary embolus. Normal heart size. No pericardial effusion. Normal caliber thoracic aorta with moderate atherosclerotic disease. Moderate three-vessel coronary artery calcifications. Mediastinum/Nodes: Patulous esophagus with air-fluid level. Abnormal communication is seen between the right lower lobe bronchus and mid right wall of the esophagus, series 4 image 50-56. Mild soft tissue thickening of the trachea and bilateral mainstem bronchi, likely posttreatment changes related to esophageal malignancy. Prominent subcentimeter AP window lymph node measuring 7 mm in short axis on series 4, image 41. No pathologically enlarged lymph nodes seen in the chest. Lungs/Pleura: Central airways are patent. Bilateral bronchial wall thickening. Numerous centrilobular  nodules seen throughout the lungs, but most pronounced in the lower lungs. Larger scattered solid pulmonary nodules, are seen, for example solid pulmonary nodule of the right lower lobe measuring 1.1 cm on series 6, image 77 and reference solid pulmonary nodule of the superior portion of the right lower lobe measuring 1.1 cm in mean diameter on image 61. Interlobular septal thickening and small right and trace left pleural effusions. Upper Abdomen: Simple appearing cyst of the right kidney. No acute abnormality. Musculoskeletal: No chest wall abnormality. No acute or significant osseous findings. Review of the MIP images confirms the above findings. IMPRESSION: 1. No evidence of pulmonary embolus. 2. Numerous small centrilobular nodules seen throughout the lungs, but most pronounced in the lower lungs with bilateral lower lobe consolidations, likely due to large volume aspiration, airways centered infection is an additional consideration. 3. Larger scattered solid pulmonary nodules are seen which may be related to acute process or  known esophageal malignancy. Correlation with outside priors could assist with further evaluation. 4. Abnormal communication is seen between the mid esophagus and right lower lobe bronchus, likely due to acquired tracheoesophageal fistula related to history of esophageal malignancy. Bronchoscopy could be performed for further evaluation. 5. Patulous esophagus with air-fluid level, findings can be seen in the setting of esophageal dysmotility. 6. Interlobular septal thickening and small right and trace left pleural effusions, possibly due to superimposed pulmonary edema. 7. Mild soft tissue thickening of the mid esophagus, trachea and bilateral mainstem bronchi, likely posttreatment changes related to esophageal malignancy. Electronically Signed   By: Yetta Glassman M.D.   On: 03/31/2022 16:45   DG Chest Portable 1 View  Result Date: 03/31/2022 CLINICAL DATA:  Shortness of breath.  EXAM: PORTABLE CHEST 1 VIEW COMPARISON:  March 22, 2022. FINDINGS: The heart size and mediastinal contours are within normal limits. Mildly increased bilateral nodular opacities are noted throughout both lungs concerning for worsening atypical infection or inflammation. No pneumothorax or pleural effusion is noted. The visualized skeletal structures are unremarkable. IMPRESSION: Mildly increased bilateral nodular opacities as noted above. Electronically Signed   By: Marijo Conception M.D.   On: 03/31/2022 11:55    Labs on Admission: I have personally reviewed following labs  CBC: Recent Labs  Lab 03/25/22 0515 03/31/22 1143  WBC 19.3* 12.7*  NEUTROABS 18.5* 12.1*  HGB 9.0* 12.3*  HCT 27.8* 39.4  MCV 84.0 86.2  PLT 347 287*   Basic Metabolic Panel: Recent Labs  Lab 03/25/22 0515 03/26/22 0512 03/31/22 1143  NA 131*  --  133*  K 4.1  --  3.3*  CL 105  --  103  CO2 19*  --  18*  GLUCOSE 121*  --  213*  BUN 15  --  19  CREATININE 0.42*  --  0.74  CALCIUM 8.9  --  8.8*  MG 1.8 1.7  --   PHOS 2.1* 3.3  --    GFR: Estimated Creatinine Clearance: 70.2 mL/min (by C-G formula based on SCr of 0.74 mg/dL).  CBG: Recent Labs  Lab 03/25/22 2357 03/26/22 0412 03/26/22 0737 03/26/22 1124 03/26/22 1547  GLUCAP 179* 174* 163* 201* 186*   Urine analysis:    Component Value Date/Time   COLORURINE YELLOW (A) 03/25/2022 0638   APPEARANCEUR CLEAR (A) 03/25/2022 0638   LABSPEC 1.018 03/25/2022 0638   PHURINE 7.0 03/25/2022 0638   GLUCOSEU NEGATIVE 03/25/2022 0638   HGBUR NEGATIVE 03/25/2022 0638   BILIRUBINUR NEGATIVE 03/25/2022 0638   KETONESUR 5 (A) 03/25/2022 0638   PROTEINUR NEGATIVE 03/25/2022 0638   NITRITE NEGATIVE 03/25/2022 0638   LEUKOCYTESUR NEGATIVE 03/25/2022 6811   CRITICAL CARE Performed by: Dr. Tobie Poet  Total critical care time: 35 minutes  Critical care time was exclusive of separately billable procedures and treating other patients.  Critical care was necessary  to treat or prevent imminent or life-threatening deterioration.  Critical care was time spent personally by me on the following activities: development of treatment plan with patient and/or surrogate as well as nursing, discussions with consultants, evaluation of patient's response to treatment, examination of patient, obtaining history from patient or surrogate, ordering and performing treatments and interventions, ordering and review of laboratory studies, ordering and review of radiographic studies, pulse oximetry and re-evaluation of patient's condition.  Dr. Tobie Poet [facility]  If 7PM-7AM, please contact overnight-coverage provider If 7AM-7PM, please contact day coverage provider [email or web address]  03/31/2022, 7:43 PM

## 2022-03-31 NOTE — ED Notes (Signed)
Pt with some coughing after taking medicine and drinking water.

## 2022-03-31 NOTE — Assessment & Plan Note (Addendum)
Due to ongoing aspiration from tracheoesophageal fistula.  Supplement O2 to keep sats 88-93%.

## 2022-03-31 NOTE — Hospital Course (Addendum)
[name] is a 71 year old male with history of esophageal carcinoma, atrial fibrillation, hypertension, hyperlipidemia, neuropathy, non-insulin-dependent diabetes mellitus, hyperlipidemia, nicotine dependence, who presents to the emergency department for chief concerns of shortness of breath.  Initial vitals in the emergency department showed temperature of 97.9, respiration rate of 32 in improved to 23, initial heart rate 172 and improved to 99, blood pressure 101/68, SPO2 of 87% on CPAP currently on BiPAP with SPO2 of 92%.  Serum sodium is 133, potassium 3.3, chloride 108, bicarb 18, BUN of 19, serum creatinine of 0.74, GFR greater than 60, nonfasting blood glucose 213.  VBG 7.45/22/188  ED treatment diltiazem 10 mg IV one-time dose, DuoNebs one-time dose, cefepime 2 g IV, vancomycin, LR 1 L bolus.  Family requested [facility] transfer.  EDP called [facility] and spoke with patient's oncologist, Dr. Reynaldo Minium who has accepted the patient pending bed availability.  Per EDP, Dr. Reynaldo Minium expected bed availability within the next 24 hours. Per EDP, [facility] transfer center is requesting a covid/influenza A/Influenza B test prior to transfer. This has been ordered and pending collection.

## 2022-03-31 NOTE — ED Provider Notes (Signed)
[facility] Provider Note    Event Date/Time   First MD Initiated Contact with Patient 03/31/22 1119     (approximate)   History   Shortness of breath  HPI  Richard Adams is a 71 y.o. male  who presents to the emergency department today because of concern for breathing difficulty. The patient was discharged 5 days ago after an admission for severe sepsis, respiratory failure. EMS states initial room air sats in the 70s. Was placed on CPAP by EMS. Was given solumedrol.   Physical Exam   Triage Vital Signs: ED Triage Vitals [03/31/22 1120]  Enc Vitals Group     BP      Pulse Rate (!) 172     Resp (!) 36     Temp      Temp src      SpO2 (!) 87 %     Weight      Height      Head Circumference      Peak Flow      Pain Score      Pain Loc      Pain Edu?      Excl. in [place]?     Most recent vital signs: Vitals:   03/31/22 1120  Pulse: (!) 172  Resp: (!) 36  SpO2: (!) 87%    General: Awake, alert, oriented. CV:  Good peripheral perfusion. Tachycardia. Resp:  Increased respiratory effort, diffuse wheezing. Abd:  No distention.   ED Results / Procedures / Treatments   Labs (all labs ordered are listed, but only abnormal results are displayed) Labs Reviewed  CBC WITH DIFFERENTIAL/PLATELET - Abnormal; Notable for the following components:      Result Value   WBC 12.7 (*)    Hemoglobin 12.3 (*)    RDW 16.9 (*)    Platelets 472 (*)    Neutro Abs 12.1 (*)    Lymphs Abs 0.4 (*)    All other components within normal limits  BASIC METABOLIC PANEL - Abnormal; Notable for the following components:   Sodium 133 (*)    Potassium 3.3 (*)    CO2 18 (*)    Glucose, Bld 213 (*)    Calcium 8.8 (*)    All other components within normal limits  BLOOD GAS, ARTERIAL - Abnormal; Notable for the following components:   pCO2 arterial 22 (*)    pO2, Arterial 188 (*)    Bicarbonate 15.3 (*)    Acid-base deficit 7.0 (*)    All other components within  normal limits  CULTURE, BLOOD (ROUTINE X 2)  CULTURE, BLOOD (ROUTINE X 2)  SARS CORONAVIRUS 2 BY RT PCR  RESPIRATORY PANEL BY PCR  TSH  TROPONIN I (HIGH SENSITIVITY)  TROPONIN I (HIGH SENSITIVITY)     EKG  I, Nance Pear, attending physician, personally viewed and interpreted this EKG  EKG Time: 1123 Rate: 178 Rhythm: atrial fibrillation with RVR Axis: left axis deviation Intervals: qtc 494 QRS: LAFB ST changes: no st elevation Impression: abnormal ekg   RADIOLOGY I independently interpreted and visualized the CXR. My interpretation: Diffuse bilateral opacities. Radiology interpretation:  IMPRESSION:  Mildly increased bilateral nodular opacities as noted above.      PROCEDURES:  Critical Care performed: Yes  CRITICAL CARE Performed by: Nance Pear   Total critical care time: 35 minutes  Critical care time was exclusive of separately billable procedures and treating other patients.  Critical care was necessary to treat or prevent imminent or  life-threatening deterioration.  Critical care was time spent personally by me on the following activities: development of treatment plan with patient and/or surrogate as well as nursing, discussions with consultants, evaluation of patient's response to treatment, examination of patient, obtaining history from patient or surrogate, ordering and performing treatments and interventions, ordering and review of laboratory studies, ordering and review of radiographic studies, pulse oximetry and re-evaluation of patient's condition.   Procedures   MEDICATIONS ORDERED IN ED: Medications  ipratropium-albuterol (DUONEB) 0.5-2.5 (3) MG/3ML nebulizer solution 3 mL (has no administration in time range)  ipratropium-albuterol (DUONEB) 0.5-2.5 (3) MG/3ML nebulizer solution 3 mL (has no administration in time range)  ipratropium-albuterol (DUONEB) 0.5-2.5 (3) MG/3ML nebulizer solution 3 mL (has no administration in time range)      IMPRESSION / MDM / ASSESSMENT AND PLAN / ED COURSE  I reviewed the triage vital signs and the nursing notes.                              Differential diagnosis includes, but is not limited to, pneumonia, pneumothorax, acs.  Patient's presentation is most consistent with acute presentation with potential threat to life or bodily function.  Patient presented to the emergency department today because of concerns for shortness of breath.  On exam patient with significant increased work of breathing.  Had been placed on CPAP by EMS.  We did transfer him over to BiPAP upon arrival.  Additionally patient was found to be in A-fib with RVR.  Patient was given dose of diltiazem here in the emergency department.  Did convert back to sinus rhythm while here in the emergency department.  In terms of the breathing difficulty were able to take patient off BiPAP and placed on nasal cannula.  Chest x-ray is concerning for worsening opacities.  Did discuss with oncology at [facility] who accepted the patient transfer however no beds will be available for roughly 24 hours.  Because of this I discussed with Dr. Tobie Poet with the hospitalist service here who will plan on admission.   FINAL CLINICAL IMPRESSION(S) / ED DIAGNOSES   Final diagnoses:  SOB (shortness of breath)  Respiratory distress     Note:  This document was prepared using Dragon voice recognition software and may include unintentional dictation errors.    Nance Pear, MD 03/31/22 [phone]

## 2022-04-01 DIAGNOSIS — J690 Pneumonitis due to inhalation of food and vomit: Secondary | ICD-10-CM

## 2022-04-01 DIAGNOSIS — E43 Unspecified severe protein-calorie malnutrition: Secondary | ICD-10-CM | POA: Diagnosis not present

## 2022-04-01 DIAGNOSIS — J860 Pyothorax with fistula: Secondary | ICD-10-CM | POA: Diagnosis not present

## 2022-04-01 DIAGNOSIS — J9601 Acute respiratory failure with hypoxia: Secondary | ICD-10-CM | POA: Diagnosis not present

## 2022-04-01 DIAGNOSIS — Z7189 Other specified counseling: Secondary | ICD-10-CM | POA: Diagnosis not present

## 2022-04-01 LAB — CBC
HCT: 28.7 % — ABNORMAL LOW (ref 39.0–52.0)
Hemoglobin: 9.3 g/dL — ABNORMAL LOW (ref 13.0–17.0)
MCH: 27.6 pg (ref 26.0–34.0)
MCHC: 32.4 g/dL (ref 30.0–36.0)
MCV: 85.2 fL (ref 80.0–100.0)
Platelets: 319 10*3/uL (ref 150–400)
RBC: 3.37 MIL/uL — ABNORMAL LOW (ref 4.22–5.81)
RDW: 17.2 % — ABNORMAL HIGH (ref 11.5–15.5)
WBC: 15.9 10*3/uL — ABNORMAL HIGH (ref 4.0–10.5)
nRBC: 0 % (ref 0.0–0.2)

## 2022-04-01 LAB — BASIC METABOLIC PANEL
Anion gap: 9 (ref 5–15)
BUN: 23 mg/dL (ref 8–23)
CO2: 18 mmol/L — ABNORMAL LOW (ref 22–32)
Calcium: 7.9 mg/dL — ABNORMAL LOW (ref 8.9–10.3)
Chloride: 107 mmol/L (ref 98–111)
Creatinine, Ser: 0.81 mg/dL (ref 0.61–1.24)
GFR, Estimated: >60 mL/min (ref >60)
Glucose, Bld: 183 mg/dL — ABNORMAL HIGH (ref 70–99)
Potassium: 3.7 mmol/L (ref 3.5–5.1)
Sodium: 134 mmol/L — ABNORMAL LOW (ref 135–145)

## 2022-04-01 MED ORDER — DIPHENHYDRAMINE HCL 25 MG PO CAPS: 25.0000 mg | ORAL_CAPSULE | Freq: Every evening | ORAL | Status: DC | PRN

## 2022-04-01 MED ORDER — OXYCODONE HCL 20 MG/ML PO CONC
10.0000 mg | ORAL | Status: DC | PRN
  Administered 2022-04-01: 10 mg via ORAL
  Filled 2022-04-01 (×2): qty 0.5

## 2022-04-01 MED ORDER — MORPHINE SULFATE (PF) 2 MG/ML IV SOLN
1.0000 mg | Freq: Once | INTRAVENOUS | Status: AC
  Administered 2022-04-01: 1 mg via INTRAVENOUS
  Filled 2022-04-01: qty 1

## 2022-04-01 MED ORDER — DIPHENHYDRAMINE HCL 50 MG/ML IJ SOLN
25.0000 mg | Freq: Every evening | INTRAMUSCULAR | Status: DC | PRN
  Administered 2022-04-01: 25 mg via INTRAVENOUS
  Filled 2022-04-01: qty 1

## 2022-04-01 MED ORDER — MORPHINE SULFATE (PF) 2 MG/ML IV SOLN
2.0000 mg | INTRAVENOUS | Status: DC | PRN
  Administered 2022-04-01 (×2): 2 mg via INTRAVENOUS
  Filled 2022-04-01: qty 1

## 2022-04-01 MED ORDER — METOPROLOL TARTRATE 5 MG/5ML IV SOLN
2.5000 mg | INTRAVENOUS | Status: DC | PRN
  Administered 2022-04-01: 2.5 mg via INTRAVENOUS
  Filled 2022-04-01: qty 5

## 2022-04-01 MED ORDER — MORPHINE SULFATE (PF) 2 MG/ML IV SOLN
2.0000 mg | INTRAVENOUS | Status: DC | PRN
  Administered 2022-04-01: 2 mg via INTRAVENOUS
  Filled 2022-04-01 (×2): qty 1

## 2022-04-01 MED ORDER — OXYCODONE HCL 5 MG PO TABS: 10.0000 mg | ORAL_TABLET | ORAL | Status: DC | PRN

## 2022-04-01 MED ORDER — OXYCODONE HCL 5 MG/5ML PO SOLN
10.0000 mg | ORAL | Status: DC | PRN
  Administered 2022-04-02: 10 mg via ORAL
  Filled 2022-04-01: qty 10

## 2022-04-01 NOTE — Hospital Course (Addendum)
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  Initially required BiPAP.  Admitted to stepdown unit with plan to transfer to [facility] oncology service at patient and wife's request.  He was accepted for transfer by his oncologist pending an available bed, expected later 8/15 or 8/16.    He was very recently admitted here for the same problem from 8/5--03/26/22 (see d/c summary for details of [place] discussions that were had).  He was discharged with home oxygen with plan to follow up with his [facility] providers, including palliative care.  Pt has been persistently adamant that he wants to drink water.  Has been only tolerating liquids at home.    Palliative care consulted for goals of care discussions and pain management given NPO status.  As of this afternoon, pt has decided he wants to be comfortable and would prefer hospice approach after he discusses further with wife when she returns later today (8/15).

## 2022-04-01 NOTE — Assessment & Plan Note (Signed)
Due to aspiration

## 2022-04-01 NOTE — Assessment & Plan Note (Addendum)
IV Lopressor PRN PO meds held

## 2022-04-01 NOTE — Plan of Care (Signed)
Discussed with patient and spouse plan of care for the evening, pain management and the reason he can't have ice chips or free water with some teach back displayed.  Explained the high risk of aspiration and that we would perform great mouth care while awake more often.  Obtained orders for an extra dos of IV pain medications until new Fentanyl patch kicks in and some for sleep by IV as well from MD on-call.  Problem: Education: Goal: Knowledge of General Education information will improve Description: Including pain rating scale, medication(s)/side effects and non-pharmacologic comfort measures Outcome: Not Progressing   Problem: Health Behavior/Discharge Planning: Goal: Ability to manage health-related needs will improve Outcome: Not Progressing

## 2022-04-01 NOTE — Progress Notes (Signed)
Initial Nutrition Assessment  DOCUMENTATION CODES:   Severe malnutrition in context of chronic illness  INTERVENTION:   Recommend NGT placement and nutrition support   If NGT placed, recommend:  Osmolite 1.5'@65ml' /hr- Initiate at 83m/hr and increase by 155mhr q 8 hours until goal rate is reached.   Free water flushes 10077m4 hours   Regimen provides 2340kcal/day, 98g/day protein and 1789m48my of free water   Pt at high refeed risk; recommend monitor potassium, magnesium and phosphorus labs daily until stable  NUTRITION DIAGNOSIS:   Severe Malnutrition related to cancer and cancer related treatments as evidenced by 25 percent weight loss in 1 year, severe fat depletion, severe muscle depletion.  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  Met with pt and pt's wife in room today. Pt reports that he is hungry today and is asking for food and water. Pt currently NPO for concerns for aspiration. Pt s/p MBSS 8/8 and was recommended for NPO. Pt has been eating since his last discharge. Wife reports pt with decreased appetite and oral intake at home. Pt has been drinking protein drinks at home but does not eat much. Pt reports a 60lb unintentional weight loss over the past several years. Per chart, pt is down 45lbs(25%) over the past year; this is significant weight loss. Pt reports today that he wants G-tube placement if he needs it. [facility] and nutrition support in the interim until pt is able to have G-tube placed or decides on GOC.[place] is at high refeed risk.  Feeding tube discussed with MD; MD will defer feeding tube decisions to pt's primary team after transfer.   Medications reviewed and include: lovenox, unasyn  Labs reviewed: Na 134(L), K 3.7 wnl Wbc- 15.9(H), Hgb 9.3(L), Hct 28.7(L)  NUTRITION - FOCUSED PHYSICAL EXAM:  Flowsheet Row Most Recent Value  Orbital Region Moderate depletion  Upper Arm Region Severe depletion  Thoracic and Lumbar Region Severe depletion  Buccal Region Moderate depletion  Temple Region Severe depletion  Clavicle Bone Region Severe depletion  Clavicle and Acromion Bone Region Severe depletion  Scapular Bone Region Severe depletion  Dorsal Hand Severe depletion  Patellar Region Severe depletion  Anterior Thigh Region Severe depletion  Posterior Calf Region Severe depletion  Edema (RD Assessment) Mild  Hair Reviewed  Eyes Reviewed  Mouth Reviewed  Skin Reviewed  Nails Reviewed   Diet Order:   Diet Order             Diet NPO time specified  Diet effective now                  EDUCATION NEEDS:   Education needs have been addressed  Skin:  Skin Assessment: Reviewed RN Assessment (ecchymosis)  Last BM:  8/14  Height:   Ht Readings from Last 1 Encounters:  03/31/22 '6\' 1"'  (1.854 m)    Weight:   Wt Readings from Last 1 Encounters:  03/31/22 63.4 kg    Ideal Body Weight:  83.6 kg  BMI:  Body mass index is 18.44 kg/m.  Estimated Nutritional  Needs:   Kcal:  2000-2300kcal/day  Protein:  100-115g/day  Fluid:  1.7-1.9L/day  Koleen Distance MS, RD, LDN Please refer to [facility] for RD and/or RD on-call/weekend/after hours pager

## 2022-04-01 NOTE — Assessment & Plan Note (Signed)
Continue Unasyn for now, until hospice/comfort transition is made.

## 2022-04-01 NOTE — Assessment & Plan Note (Signed)
Dietitian has placed tube feed recommendations. Expect transition to comfort/hospice.

## 2022-04-01 NOTE — Progress Notes (Signed)
Progress Note   Patient: Richard Adams OHY:[id number] DOB: 09/19/1950 DOA: 03/31/2022     1 DOS: the patient was seen and examined on 04/01/2022   Brief hospital course: [name] is a 71 year old male with history of esophageal carcinoma, atrial fibrillation, hypertension, hyperlipidemia, neuropathy, non-insulin-dependent diabetes mellitus, hyperlipidemia, nicotine dependence, who presents to the emergency department for chief concerns of shortness of breath.  Initial vitals in the emergency department showed temperature of 97.9, respiration rate of 32 in improved to 23, initial heart rate 172 and improved to 99, blood pressure 101/68, SPO2 of 87% on CPAP currently on BiPAP with SPO2 of 92%.  Serum sodium is 133, potassium 3.3, chloride 108, bicarb 18, BUN of 19, serum creatinine of 0.74, GFR greater than 60, nonfasting blood glucose 213.  VBG 7.45/22/188  ED treatment diltiazem 10 mg IV one-time dose, DuoNebs one-time dose, cefepime 2 g IV, vancomycin, LR 1 L bolus.  Family requested [facility] transfer.  EDP called [facility] and spoke with patient's oncologist, Dr. Reynaldo Minium who has accepted the patient pending bed availability.  Per EDP, Dr. Reynaldo Minium expected bed availability within the next 24 hours. Per EDP, [facility] transfer center is requesting a covid/influenza A/Influenza B test prior to transfer. This has been ordered and pending collection.  Assessment and Plan: * Acute hypoxemic respiratory failure (HCC) Due to ongoing aspiration from tracheoesophageal fistula.  Supplement O2 to keep sats 88-93%.  Aspiration pneumonia ([place]) Continue Unasyn for now, until hospice/comfort transition is made.  PAF (paroxysmal atrial fibrillation) (HCC) IV Lopressor PRN PO meds held  COPD (chronic obstructive pulmonary disease) (HCC) - DuoNebs every 6 hours as needed for wheezing and shortness of breath, 4 days ordered - Resumed home Anoro Ellipta, 2 puffs daily  Tobacco use disorder -  Nicotine patch as needed ordered  Protein-calorie malnutrition, severe Dietitian has placed tube feed recommendations. Expect transition to comfort/hospice.  Tracheoesophageal fistula, acquired ([place]) Maintain NPO until [place] finalized and patient started on hospice/comfort care.  Leukocytosis Due to aspiration  Chronic back pain Home fentanyl patch resumed. IV morphine PRN  GERD (gastroesophageal reflux disease) IV PPI since PO meds held  Hyperlipidemia PO meds held        Subjective: Pt seen in ICU with wife at bedside.  He is adamant to drink water, saying "this is torture".  Discussed his code status, and high risk of respiratory and/or cardiac arrest if he is swallowing any food or liquids.  He continued to insist on water, and wanted to remain full code.    Palliative later met with patient, wife was not present.  Pt expressed his wish for DNR code status and to be comfortable and enjoy quality of life he has left.  He wants to discuss with wife when she returns to the hospital.   Physical Exam: Vitals:   04/01/22 0900 04/01/22 1000 04/01/22 1100 04/01/22 1200  BP: '97/74 92/72 96/72 ' 90/73  Pulse: (!) 124 (!) 106 (!) 105 (!) 114  Resp: 20 (!) 30 17 (!) 26  Temp:    97.6 F (36.4 C)  TempSrc:    Oral  SpO2: 100% 100% 98% 99%  Weight:      Height:       General exam: awake, alert, no acute distress, cachectic, frail, chronically ill-appearing HEENT: sunken eyes, dry mucus membranes, hearing grossly normal  Respiratory system: coarse rhonchi throughout, normal respiratory effort at rest on [place] oxygen. Cardiovascular system: tachycardic, no pedal edema.   Gastrointestinal system: sunken non-tender abdomen  Central nervous system: A&O x3. no gross focal neurologic deficits, normal speech Extremities: moves all, no edema, normal tone Skin: dry, intact, normal temperature Psychiatry: normal mood, congruent affect, abnormal judgement and insight    Data  Reviewed:  Notable labs --- Na 134, CO2 18, glucose 183, Ca 7.9, WBC 156.9, Hbg 9.3 from 12.3  Family Communication: wife at bedside  Disposition: Status is: Inpatient Remains inpatient appropriate because: severity of illness as outlined above   Planned Discharge Destination:  Home with hospice    Time spent: 55 minutes including time at bedside and in coordination of care  Author: Ezekiel Slocumb, DO 04/01/2022 3:54 PM  For on call review [email or web address].

## 2022-04-01 NOTE — Consult Note (Incomplete)
Consultation Note Date: 04/01/2022   Patient Name: Richard Adams  DOB: November 16, 1950  MRN: [id number]  Age / Sex: 71 y.o., male  PCP: [name], PA-C Referring Physician: Ezekiel Slocumb, DO  Reason for Consultation: {Reason for Consult:23484}  HPI/Patient Profile: 71 y.o. male  with past medical history of *** admitted on 03/31/2022 with ***.   Clinical Assessment and Goals of Care: Notes, diagnostics and labs reviewed. He is resting in bed with no family at bedside. He is a retired Engineer, structural. He is married with 1 child and 1 grandchild.   He states he has been feeling poorly for the past year. We discussed care at [facility]. He states "it's not the cancer  killing me, it's all this other stuff".   We discussed his diagnoses, prognosis, GOC, EOL wishes disposition and options.  Created space and opportunity for patient  to explore thoughts and feelings regarding current medical information.   A detailed discussion was had today regarding advanced directives.  Concepts specific to code status, artifical feeding and hydration, IV antibiotics and rehospitalization were discussed.  The difference between an aggressive medical intervention path and a comfort care path was discussed.  Values and goals of care important to patient and family were attempted to be elicited.  Discussed limitations of medical interventions to prolong quality of life in some situations and discussed the concept of human mortality.  Discussed his swallowing and the CT scan performed yesterday in great detail. Discussed what all this means and the larger context of his issues.       SUMMARY OF RECOMMENDATIONS   *** Code Status/Advance Care Planning: {Palliative Code status:23503}   Symptom Management:  ***  Palliative Prophylaxis:  {Palliative Prophylaxis:21015}  Additional Recommendations (Limitations, Scope,  Preferences): {Recommended Scope and Preferences:21019}  Psycho-social/Spiritual:  Desire for further Chaplaincy support:{YES NO:22349} Additional Recommendations: {PAL SOCIAL:21064}  Prognosis:  {Palliative Care Prognosis:23504}  Discharge Planning: {Palliative dispostion:23505}      Primary Diagnoses: Present on Admission:  Acute hypoxemic respiratory failure (HCC)  COPD (chronic obstructive pulmonary disease) (HCC)  Hyperlipidemia  PAF (paroxysmal atrial fibrillation) (HCC)  Tobacco use disorder  GERD (gastroesophageal reflux disease)  Leukocytosis  Tracheoesophageal fistula, acquired ([place])  Acute respiratory failure with hypoxia (HCC)  Chronic back pain   I have reviewed the medical record, interviewed the patient and family, and examined the patient. The following aspects are pertinent.  Past Medical History:  Diagnosis Date   Alcohol dependence with intoxication ([place])    Alcohol induced insomnia (HCC)    Atrial fibrillation with RVR (HCC)    Chronic bronchitis (HCC)    COPD (chronic obstructive pulmonary disease) (HCC)    Diabetes mellitus without complication (HCC)    ED (erectile dysfunction)    GERD (gastroesophageal reflux disease)    Headache    History of subdural hematoma    Hypertension    Migraine    Mixed hyperlipidemia    PAF (paroxysmal atrial fibrillation) ([place])    Screening for malignant neoplasm of respiratory organ  SOB (shortness of breath)    Tobacco use disorder    Type 2 diabetes mellitus with peripheral neuropathy (HCC)    Vertigo    Social History   Socioeconomic History   Marital status: Married    Spouse name: Not on file   Number of children: Not on file   Years of education: Not on file   Highest education level: Not on file  Occupational History   Not on file  Tobacco Use   Smoking status: Every Day    Packs/day: 1.00    Types: Cigarettes   Smokeless tobacco: Never  Vaping Use   Vaping Use: Never used  Substance  and Sexual Activity   Alcohol use: Yes    Comment: 6-8 drink a day   Drug use: Never   Sexual activity: Not on file  Other Topics Concern   Not on file  Social History Narrative   Not on file   Social Determinants of Health   Financial Resource Strain: Not on file  Food Insecurity: Not on file  Transportation Needs: Not on file  Physical Activity: Not on file  Stress: Not on file  Social Connections: Not on file   Family History  Problem Relation Age of Onset   Throat cancer Mother    Scheduled Meds:  Chlorhexidine Gluconate Cloth  6 each Topical Q0600   enoxaparin (LOVENOX) injection  40 mg Subcutaneous QHS   fentaNYL  1 patch Transdermal Q72H   mouth rinse  15 mL Mouth Rinse 4 times per day   umeclidinium-vilanterol  2 puff Inhalation Daily   Continuous Infusions:  ampicillin-sulbactam (UNASYN) IV 3 g (04/01/22 0842)   PRN Meds:.acetaminophen **OR** acetaminophen, albuterol, ipratropium-albuterol, metoprolol tartrate, morphine injection, nicotine, ondansetron **OR** ondansetron (ZOFRAN) IV, mouth rinse, oxyCODONE, pantoprazole (PROTONIX) IV Medications Prior to Admission:  Prior to Admission medications   Medication Sig Start Date End Date Taking? Authorizing Provider  acetaminophen (TYLENOL CHILDRENS) 160 MG/5ML suspension Take 20 mLs by mouth every 6 (six) hours as needed. 11/27/21  Yes [provider]  albuterol (VENTOLIN HFA) 108 (90 Base) MCG/ACT inhaler Inhale 2 puffs into the lungs every 6 (six) hours as needed. 08/20/21 08/20/22 Yes [provider]  amoxicillin-clavulanate (AUGMENTIN) 250-62.5 MG/5ML suspension Take 5 mLs (250 mg total) by mouth 2 (two) times daily for 10 days. 03/26/22 04/05/22 Yes Lorella Nimrod, MD  barrier cream (NON-SPECIFIED) CREA Apply 1 Application topically 2 (two) times daily as needed. Apply to buttocks   Yes [provider]  benzonatate (TESSALON) 200 MG capsule Take 200 mg by mouth 3 (three) times daily as needed.  12/12/21  Yes [provider]  DPH-Lido-AlHydr-MgHydr-Simeth (FIRST-MOUTHWASH BLM) SUSP Take 5 mLs by mouth in the morning and at bedtime. 03/13/22  Yes [provider]  fentaNYL (DURAGESIC) 25 MCG/HR Place 1 patch onto the skin every 3 (three) days.   Yes [provider]  flecainide (TAMBOCOR) 100 MG tablet Take 100 mg by mouth 2 (two) times daily.   Yes [provider]  folic acid (FOLVITE) 1 MG tablet Take 1 mg by mouth daily.   Yes [provider]  gabapentin (NEURONTIN) 250 MG/5ML solution Take 12 mLs by mouth in the morning, at noon, and at bedtime. 08/12/21  Yes [provider]  hydrocortisone 1 % ointment Apply 1 Application topically daily.   Yes [provider]  Levothyroxine Sodium (TIROSINT-SOL) 137 MCG/ML SOLN Take 137 mcg by mouth in the morning. 11/22/21  Yes [provider]  loperamide (IMODIUM) 2 MG capsule Take 2 mg by mouth as needed for diarrhea or loose stools.   Yes [provider]  megestrol (MEGACE) 40 MG/ML suspension Take 10 mLs by mouth daily. 03/13/22 03/13/23 Yes [provider]  metFORMIN (GLUCOPHAGE) 500 MG tablet Take 500 mg by mouth 2 (two) times daily with a meal. 01/27/18 03/31/22 Yes [provider]  metoprolol succinate (TOPROL-XL) 25 MG 24 hr tablet Take 12.5 mg by mouth daily. 03/04/18 03/31/22 Yes [provider]  midodrine (PROAMATINE) 5 MG tablet Take 5 mg by mouth in the morning, at noon, and at bedtime. 03/13/22 03/13/23 Yes [provider]  ondansetron (ZOFRAN) 8 MG tablet Take 8 mg by mouth every 8 (eight) hours as needed. 01/14/22  Yes [provider]  oxyCODONE (OXY IR/ROXICODONE) 5 MG immediate release tablet Take 10 mg by mouth every 4 (four) hours as needed for severe pain. 03/06/22  Yes [provider]  pantoprazole (PROTONIX) 40 MG tablet Take 40 mg by mouth daily.   Yes [provider]  polyethylene glycol powder  (GLYCOLAX/MIRALAX) 17 GM/SCOOP powder Take by mouth.   Yes [provider]  rosuvastatin (CRESTOR) 5 MG tablet Take 5 mg by mouth 3 (three) times a week. 07/30/18  Yes [provider]  temazepam (RESTORIL) 15 MG capsule Take 15 mg by mouth at bedtime as needed. 03/13/22 05-07-22 Yes [provider]  thiamine (VITAMIN B1) 100 MG tablet Take 100 mg by mouth daily. 10/22/21 10/22/22 Yes [provider]  triamcinolone ointment (KENALOG) 0.1 % as needed. 11/20/17  Yes [provider]  umeclidinium-vilanterol (ANORO ELLIPTA) 62.5-25 MCG/INH AEPB Inhale 2 puffs into the lungs daily.   Yes [provider]  amoxicillin-clavulanate (AUGMENTIN) 250-62.5 MG/5ML suspension Take 5 mLs (250 mg total) by mouth 2 (two) times daily for 10 days. 03/26/22 04/05/22  Lorella Nimrod, MD  EPINEPHrine 0.3 mg/0.3 mL IJ SOAJ injection as needed. 06/11/18   [provider]  famotidine (PEPCID) 20 MG tablet Take by mouth as needed. Patient not taking: Reported on 03/22/2022 06/11/18   [provider]  miconazole (MICOTIN) 2 % cream Apply 1 Application topically 2 (two) times daily.    [provider]  morphine (MS CONTIN) 30 MG 12 hr tablet Take 1 tablet (30 mg total) by mouth every 8 (eight) hours as needed for up to 6 days. Patient not taking: Reported on 03/31/2022 03/26/22 04/01/22  Lorella Nimrod, MD  tamsulosin (FLOMAX) 0.4 MG CAPS capsule Take 0.4 mg by mouth daily. Take one-half hour after the same meal each day Patient not taking: Reported on 03/31/2022 03/21/22   [provider]  Wheat Dextrin (BENEFIBER DRINK MIX PO) Take 1 Bottle by mouth daily as needed (FIBER).    [provider]   Allergies  Allergen Reactions   Succinylcholine Other (See Comments)    Paralyzing for 3 days after last dose   Wasp Venom Protein Anaphylaxis   Sertraline Hcl Other (See Comments)    Felt like it burned the throat. Felt different in bad way    Naltrexone    Review of Systems  Physical Exam  Vital Signs: BP 90/73   Pulse (!) 114   Temp 97.6 F (36.4 C) (Oral)   Resp (!) 26   Ht '6\' 1"'$  (1.854 m)   Wt 63.4 kg   SpO2 99%   BMI 18.44 kg/m  Pain Scale: 0-10   Pain Score: 8    SpO2: SpO2: 99 % O2  Device:SpO2: 99 % O2 Flow Rate: .O2 Flow Rate (L/min): 15 L/min  IO: Intake/output summary:  Intake/Output Summary (Last 24 hours) at 04/01/2022 1421 Last data filed at 04/01/2022 0700 Gross per 24 hour  Intake 200 ml  Output 500 ml  Net -300 ml    LBM: Last BM Date : 03/31/22 (Per patient and his wife) Baseline Weight: Weight: 63.4 kg Most recent weight: Weight: 63.4 kg     Palliative Assessment/Data:     Time In: *** Time Out: *** Time Total: *** Greater than 50%  of this time was spent counseling and coordinating care related to the above assessment and plan.  Signed by: Asencion Gowda, NP   Please contact Palliative Medicine Team phone at [phone] for questions and concerns.  For individual provider: See Shea Evans

## 2022-04-01 NOTE — Assessment & Plan Note (Signed)
PO meds held

## 2022-04-01 NOTE — Assessment & Plan Note (Signed)
IV PPI since PO meds held

## 2022-04-01 NOTE — Plan of Care (Addendum)
Full note to follow.  Patient wants DNR/DNI status. Wants water to drink. Ultimately wants home with hospice but needs to talk to wife before initiating. MOST form completed. Oxycodone concentrated solution ordered. Will speak to wife tomorrow morning at 9:00.

## 2022-04-02 DIAGNOSIS — J9601 Acute respiratory failure with hypoxia: Secondary | ICD-10-CM | POA: Diagnosis not present

## 2022-04-02 DIAGNOSIS — Z7189 Other specified counseling: Secondary | ICD-10-CM | POA: Diagnosis not present

## 2022-04-02 MED ORDER — NICOTINE 21 MG/24HR TD PT24: 21.0000 mg | MEDICATED_PATCH | Freq: Every day | TRANSDERMAL | 0 refills | Status: AC | PRN

## 2022-04-02 MED ORDER — OXYCODONE HCL 20 MG/ML PO CONC
10.0000 mg | ORAL | Status: DC | PRN
  Administered 2022-04-02: 15 mg via ORAL
  Administered 2022-04-02: 10 mg via ORAL
  Filled 2022-04-02: qty 1
  Filled 2022-04-02: qty 0.5

## 2022-04-02 MED ORDER — AMOXICILLIN-POT CLAVULANATE 875-125 MG PO TABS: 1.0000 | ORAL_TABLET | Freq: Two times a day (BID) | ORAL | 0 refills | Status: AC

## 2022-04-02 NOTE — Progress Notes (Signed)
Patient discharged via EMS to home with wife. Will have home hospice coming out. Patient's wife said DME has been delivered for patient to their home. Patient Alert breathing regular upon discharge with EMS.

## 2022-04-02 NOTE — Progress Notes (Signed)
[facility]) Hospital Liaison Note   Received request from Transitions of Care Manager, Colletta Maryland, for hospice services at home after discharge. Chart and patient information under review by [facility] physician. Hospice eligibility approved.   Spoke with patient & wife/Andrea to initiate education related to hospice philosophy, services, and team approach to care. Both verbalized understanding of information given. Per discussion, the plan is for patient to discharge home via tbd once cleared to DC.    DME needs discussed. Patient has the following equipment in the home: O2 @ 4-6 L through [facility] Patient requests the following equipment for delivery: O2 @ [facility]  Address verified and is correct in the chart. [name] is the family member to contact to arrange time of equipment delivery.    Please send signed and completed DNR home with patient/family. Please provide prescriptions at discharge as needed to ensure ongoing symptom management.    [facility] information and contact numbers given to family & above information shared with TOC.   Please call with any questions/concerns.    Thank you for the opportunity to participate in this patient's care.   Daphene Calamity, MSW [facility] Liaison  [phone]

## 2022-04-02 NOTE — Plan of Care (Signed)
  Problem: Fluid Volume: Goal: Hemodynamic stability will improve Outcome: Progressing   Problem: Clinical Measurements: Goal: Diagnostic test results will improve Outcome: Progressing Goal: Signs and symptoms of infection will decrease Outcome: Progressing   Problem: Respiratory: Goal: Ability to maintain adequate ventilation will improve Outcome: Progressing   Problem: Health Behavior/Discharge Planning: Goal: Ability to manage health-related needs will improve Outcome: Progressing

## 2022-04-02 NOTE — Discharge Summary (Signed)
Richard Adams UXL:[id number] DOB: May 15, 1951 DOA: 03/31/2022  PCP: Richard Sinclair, PA-C  Admit date: 03/31/2022 Discharge date: 04/02/2022  Admitted From: home Disposition:  home with hospice    Discharge Condition:Stable CODE STATUS:DNR  Diet recommendation: regular /carb modified as tolerated   Brief/Interim Summary: Per HPI:[name] is a 71 year old male with history of esophageal carcinoma, atrial fibrillation, hypertension, hyperlipidemia, neuropathy, non-insulin-dependent diabetes mellitus, hyperlipidemia, nicotine dependence, who presents to the emergency department for chief concerns of shortness of breath.   Initial vitals in the emergency department showed temperature of 97.9, respiration rate of 32 in improved to 23, initial heart rate 172 and improved to 99, blood pressure 101/68, SPO2 of 87% on CPAP currently on BiPAP with SPO2 of 92%.   Serum sodium is 133, potassium 3.3, chloride 108, bicarb 18, BUN of 19, serum creatinine of 0.74, GFR greater than 60, nonfasting blood glucose 213.  Patient and family now decided on hospice care at home. Palliative was consulted and transitioned to home with hospice.  Acute hypoxemic respiratory failure (HCC) Due to ongoing aspiration from tracheoesophageal fistula.  Supplement O2 to keep sats 88-93%. Tx for aspiration    Aspiration pneumonia ([place]) Transition to augmentin on discharge   PAF (paroxysmal atrial fibrillation) (HCC) On beta blk   COPD (chronic obstructive pulmonary disease) (HCC)  -02 supplementation   Tobacco use disorder - Nicotine patch    Protein-calorie malnutrition, severe As tolerates   Tracheoesophageal fistula, acquired ([place]) Hospice care   Leukocytosis Due to aspiration   Chronic back pain Continue home pain meds    GERD (gastroesophageal reflux disease) Ppi as tolerated   Hyperlipidemia Hold statin on hospice  Discharge Diagnoses:  Principal Problem:   Acute hypoxemic  respiratory failure ([place]) Active Problems:   Aspiration pneumonia (HCC)   COPD (chronic obstructive pulmonary disease) (HCC)   PAF (paroxysmal atrial fibrillation) (HCC)   Tobacco use disorder   Hyperlipidemia   GERD (gastroesophageal reflux disease)   Chronic back pain   Leukocytosis   Tracheoesophageal fistula, acquired ([place])   Protein-calorie malnutrition, severe    Discharge Instructions  Discharge Instructions     Diet - low sodium heart healthy   Complete by: As directed    Increase activity slowly   Complete by: As directed       Allergies as of 04/02/2022       Reactions   Succinylcholine Other (See Comments)   Paralyzing for 3 days after last dose   Wasp Venom Protein Anaphylaxis   Sertraline Hcl Other (See Comments)   Felt like it burned the throat. Felt different in bad way   Naltrexone         Medication List     STOP taking these medications    amoxicillin-clavulanate 250-62.5 MG/5ML suspension Commonly known as: AUGMENTIN Replaced by: amoxicillin-clavulanate 875-125 MG tablet   famotidine 20 MG tablet Commonly known as: PEPCID   folic acid 1 MG tablet Commonly known as: FOLVITE   metFORMIN 500 MG tablet Commonly known as: GLUCOPHAGE   metoprolol succinate 25 MG 24 hr tablet Commonly known as: TOPROL-XL   midodrine 5 MG tablet Commonly known as: PROAMATINE   morphine 30 MG 12 hr tablet Commonly known as: MS CONTIN   rosuvastatin 5 MG tablet Commonly known as: CRESTOR   tamsulosin 0.4 MG Caps capsule Commonly known as: FLOMAX   temazepam 15 MG capsule Commonly known as: RESTORIL   thiamine 100 MG tablet Commonly known as: VITAMIN B1   triamcinolone ointment 0.1 %  Commonly known as: KENALOG       TAKE these medications    albuterol 108 (90 Base) MCG/ACT inhaler Commonly known as: VENTOLIN HFA Inhale 2 puffs into the lungs every 6 (six) hours as needed.   amoxicillin-clavulanate 875-125 MG tablet Commonly known as:  AUGMENTIN Take 1 tablet by mouth 2 (two) times daily for 4 days. Replaces: amoxicillin-clavulanate 250-62.5 MG/5ML suspension   Anoro Ellipta 62.5-25 MCG/ACT Aepb Generic drug: umeclidinium-vilanterol Inhale 2 puffs into the lungs daily.   barrier cream Crea Commonly known as: non-specified Apply 1 Application topically 2 (two) times daily as needed. Apply to buttocks   BENEFIBER DRINK MIX PO Take 1 Bottle by mouth daily as needed (FIBER).   benzonatate 200 MG capsule Commonly known as: TESSALON Take 200 mg by mouth 3 (three) times daily as needed.   EPINEPHrine 0.3 mg/0.3 mL Soaj injection Commonly known as: EPI-PEN as needed.   fentaNYL 25 MCG/HR Commonly known as: [place] 1 patch onto the skin every 3 (three) days.   First-Mouthwash BLM Susp Take 5 mLs by mouth in the morning and at bedtime.   flecainide 100 MG tablet Commonly known as: TAMBOCOR Take 100 mg by mouth 2 (two) times daily.   gabapentin 250 MG/5ML solution Commonly known as: NEURONTIN Take 12 mLs by mouth in the morning, at noon, and at bedtime.   hydrocortisone 1 % ointment Apply 1 Application topically daily.   loperamide 2 MG capsule Commonly known as: IMODIUM Take 2 mg by mouth as needed for diarrhea or loose stools.   megestrol 40 MG/ML suspension Commonly known as: MEGACE Take 10 mLs by mouth daily.   miconazole 2 % cream Commonly known as: MICOTIN Apply 1 Application topically 2 (two) times daily.   nicotine 21 mg/24hr patch Commonly known as: NICODERM CQ - dosed in mg/24 hours Place 1 patch (21 mg total) onto the skin daily as needed (nicotine craving).   ondansetron 8 MG tablet Commonly known as: ZOFRAN Take 8 mg by mouth every 8 (eight) hours as needed.   oxyCODONE 5 MG immediate release tablet Commonly known as: Oxy IR/ROXICODONE Take 10 mg by mouth every 4 (four) hours as needed for severe pain.   polyethylene glycol powder 17 GM/SCOOP powder Commonly known as:  GLYCOLAX/MIRALAX Take by mouth.   Protonix 40 MG tablet Generic drug: pantoprazole Take 40 mg by mouth daily.   Tirosint-SOL 137 MCG/ML Soln Generic drug: Levothyroxine Sodium Take 137 mcg by mouth in the morning.   Tylenol Childrens 160 MG/5ML suspension Generic drug: acetaminophen Take 20 mLs by mouth every 6 (six) hours as needed.               Durable Medical Equipment  (From admission, onward)           Start     Ordered   04/02/22 1337  For home use only DME Hospital bed  Once       Question Answer Comment  Length of Need Lifetime   The above medical condition requires: Patient requires the ability to reposition frequently   Head must be elevated greater than: 45 degrees   Bed type Semi-electric   Hoyer Lift Yes   Trapeze Bar Yes   Support Surface: Alternating Pressure Pad and Pump      04/02/22 1337            Allergies  Allergen Reactions   Succinylcholine Other (See Comments)    Paralyzing for 3 days after last dose  Wasp Venom Protein Anaphylaxis   Sertraline Hcl Other (See Comments)    Felt like it burned the throat. Felt different in bad way   Naltrexone     Consultations:    Procedures/Studies: CT Angio Chest Pulmonary Embolism (PE) W or WO Contrast  Result Date: 03/31/2022 CLINICAL DATA:  Pulmonary embolus suspected; * Tracking Code: BO * EXAM: CT ANGIOGRAPHY CHEST WITH CONTRAST TECHNIQUE: Multidetector CT imaging of the chest was performed using the standard protocol during bolus administration of intravenous contrast. Multiplanar CT image reconstructions and MIPs were obtained to evaluate the vascular anatomy. RADIATION DOSE REDUCTION: This exam was performed according to the departmental dose-optimization program which includes automated exposure control, adjustment of the mA and/or kV according to patient size and/or use of iterative reconstruction technique. CONTRAST:  25m OMNIPAQUE IOHEXOL 350 MG/ML SOLN COMPARISON:  None  Available. FINDINGS: Cardiovascular: No evidence of pulmonary embolus. Normal heart size. No pericardial effusion. Normal caliber thoracic aorta with moderate atherosclerotic disease. Moderate three-vessel coronary artery calcifications. Mediastinum/Nodes: Patulous esophagus with air-fluid level. Abnormal communication is seen between the right lower lobe bronchus and mid right wall of the esophagus, series 4 image 50-56. Mild soft tissue thickening of the trachea and bilateral mainstem bronchi, likely posttreatment changes related to esophageal malignancy. Prominent subcentimeter AP window lymph node measuring 7 mm in short axis on series 4, image 41. No pathologically enlarged lymph nodes seen in the chest. Lungs/Pleura: Central airways are patent. Bilateral bronchial wall thickening. Numerous centrilobular nodules seen throughout the lungs, but most pronounced in the lower lungs. Larger scattered solid pulmonary nodules, are seen, for example solid pulmonary nodule of the right lower lobe measuring 1.1 cm on series 6, image 77 and reference solid pulmonary nodule of the superior portion of the right lower lobe measuring 1.1 cm in mean diameter on image 61. Interlobular septal thickening and small right and trace left pleural effusions. Upper Abdomen: Simple appearing cyst of the right kidney. No acute abnormality. Musculoskeletal: No chest wall abnormality. No acute or significant osseous findings. Review of the MIP images confirms the above findings. IMPRESSION: 1. No evidence of pulmonary embolus. 2. Numerous small centrilobular nodules seen throughout the lungs, but most pronounced in the lower lungs with bilateral lower lobe consolidations, likely due to large volume aspiration, airways centered infection is an additional consideration. 3. Larger scattered solid pulmonary nodules are seen which may be related to acute process or known esophageal malignancy. Correlation with outside priors could assist with  further evaluation. 4. Abnormal communication is seen between the mid esophagus and right lower lobe bronchus, likely due to acquired tracheoesophageal fistula related to history of esophageal malignancy. Bronchoscopy could be performed for further evaluation. 5. Patulous esophagus with air-fluid level, findings can be seen in the setting of esophageal dysmotility. 6. Interlobular septal thickening and small right and trace left pleural effusions, possibly due to superimposed pulmonary edema. 7. Mild soft tissue thickening of the mid esophagus, trachea and bilateral mainstem bronchi, likely posttreatment changes related to esophageal malignancy. Electronically Signed   By: LYetta GlassmanM.D.   On: 03/31/2022 16:45   DG Chest Portable 1 View  Result Date: 03/31/2022 CLINICAL DATA:  Shortness of breath. EXAM: PORTABLE CHEST 1 VIEW COMPARISON:  March 22, 2022. FINDINGS: The heart size and mediastinal contours are within normal limits. Mildly increased bilateral nodular opacities are noted throughout both lungs concerning for worsening atypical infection or inflammation. No pneumothorax or pleural effusion is noted. The visualized skeletal structures are unremarkable. IMPRESSION: Mildly  increased bilateral nodular opacities as noted above. Electronically Signed   By: Marijo Conception M.D.   On: 03/31/2022 11:55   DG Swallowing Func-Speech Pathology  Result Date: 03/25/2022 Table formatting from the original result was not included. Images from the original result were not included. Objective Swallowing Evaluation: Type of Study: MBS-Modified Barium Swallow Study  Patient Details Name: Renne Platts MRN: [id number] Date of Birth: 02/06/1951 Today's Date: 03/25/2022 Time: SLP Start Time (ACUTE ONLY): 1500 -SLP Stop Time (ACUTE ONLY): 4098 SLP Time Calculation (min) (ACUTE ONLY): 30 min Past Medical History: Past Medical History: Diagnosis Date  Alcohol dependence with intoxication ([place])   Alcohol induced insomnia  ([place])   Atrial fibrillation with RVR (HCC)   Chronic bronchitis (HCC)   COPD (chronic obstructive pulmonary disease) ([place])   Diabetes mellitus without complication ([place])   ED (erectile dysfunction)   GERD (gastroesophageal reflux disease)   Headache   History of subdural hematoma   Hypertension   Migraine   Mixed hyperlipidemia   PAF (paroxysmal atrial fibrillation) ([place])   Screening for malignant neoplasm of respiratory organ   SOB (shortness of breath)   Tobacco use disorder   Type 2 diabetes mellitus with peripheral neuropathy (HCC)   Vertigo  Past Surgical History: Past Surgical History: Procedure Laterality Date  COLONOSCOPY    ESOPHAGOGASTRODUODENOSCOPY (EGD) WITH PROPOFOL N/A 12/14/2019  Procedure: ESOPHAGOGASTRODUODENOSCOPY (EGD) WITH PROPOFOL;  Surgeon: Toledo, Benay Pike, MD;  Location: [facility] ENDOSCOPY;  Service: Gastroenterology;  Laterality: N/A;  FLEXIBLE BRONCHOSCOPY W/ UPPER ENDOSCOPY   HPI: Per H&P "Aryon Nham is a 71 y.o. 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  Patient completed neoadjuvant chemoradiation therapy in June, 2021.  He was diagnosed with locally recurrent disease in March, 2022 and has pulmonary nodules suspicious for metastatic disease.  He is currently on single agent therapy with pembrolizumab which he gets every 3 weeks.  Patient has worsening dysphagia and according to the wife he is only able to tolerate liquid diet at this time.  Patient's wife also states that he was recently treated for aspiration pneumonia with Augmentin and azithromycin and has completed antibiotic therapy.  Patient's wife called EMS earlier this morning because of worsening shortness of breath.  Per EMS patient had room air pulse oximetry of 82%  and was placed on CPAP with improvement in his pulse oximetry to 93%.  He was noted to be in rapid A-fib with heart rate between 89 and 171.  He has a cough productive of beige phlegm but denies having any fever or chills.  He has no nausea, no vomiting, no dizziness, no lightheadedness, no leg swelling, no headache, no blurred vision no focal deficit.  Labs show an initial lactate of 4.5 which shows a downward trend as well as marked leukocytosis.  Chest x-ray shows bilateral nodular opacities which may reflect underlying malignancy as well as diffuse reticulonodular opacities throughout the right lung and left lower lobe which may reflect superimposed inflammation or infection.  Patient received aggressive IV fluid resuscitation and antibiotic therapy and is currently on a Cardizem drip.  He will be admitted to the hospital for further evaluation."  Subjective: "let's get this over with"  Recommendations for follow up therapy are one component of a multi-disciplinary discharge planning process, led by the attending  physician.  Recommendations may be updated based on patient status, additional functional criteria and insurance authorization. Assessment / Plan / Recommendation   03/25/2022   4:00 PM Clinical Impressions Clinical Impression Pt presents with moderate oropharyngeal dysphagia that results in silent aspiration and places pt at a high risk of further compromise. Pt's oral phase is c/b difficulty with postior propulsion of thin liquids via spoon, via cup and straw as well as nectar thick liquids via spoon and cup. Decreased bolus cohesion results in premature spillage of all boluses and piecemeal swallows (~ 3 swallow per bolus). Pt's pharyngeal phase is c/b delayed swallow initiation with boluses resting in the pyriform sinuses before swallow is initiated. This results in consistent penetration during the swallow that accumulates and results in silent aspiration. Pt also presents with moderate pharyngeal  residue in the vallecula and pyriform sinuses that appears to be aspirated with fluro is turned back on. At this time, pt is at a very high risk of aspirating when consuming thin liquids and nectar thick liquids. Will defer to Palliative Care to engage with pt and his regarding [place]. SLP Visit Diagnosis Dysphagia, oropharyngeal phase (R13.12) Impact on safety and function Moderate aspiration risk;Risk for inadequate nutrition/hydration     03/25/2022   4:00 PM Treatment Recommendations Treatment Recommendations No treatment recommended at this time     03/25/2022   4:00 PM Prognosis Prognosis for Safe Diet Advancement Guarded Barriers to Reach Goals Severity of deficits;Time post onset   03/25/2022   4:00 PM Diet Recommendations SLP Diet Recommendations NPO Medication Administration Via alternative means     03/25/2022   4:00 PM Other Recommendations Oral Care Recommendations Oral care QID Follow Up Recommendations No SLP follow up Functional Status Assessment Patient has not had a recent decline in their functional status    No data to display        03/25/2022   4:00 PM Oral Phase Oral Phase Impaired Oral - Nectar Teaspoon Weak lingual manipulation;Reduced posterior propulsion;Holding of bolus;Piecemeal swallowing;Delayed oral transit;Decreased bolus cohesion;Premature spillage;Lingual/palatal residue Oral - Nectar Cup Weak lingual manipulation;Reduced posterior propulsion;Holding of bolus;Piecemeal swallowing;Delayed oral transit;Decreased bolus cohesion;Premature spillage;Lingual/palatal residue Oral - Thin Teaspoon Weak lingual manipulation;Reduced posterior propulsion;Holding of bolus;Lingual/palatal residue;Piecemeal swallowing;Delayed oral transit;Decreased bolus cohesion;Premature spillage Oral - Thin Cup Weak lingual manipulation;Reduced posterior propulsion;Holding of bolus;Lingual/palatal residue;Piecemeal swallowing;Delayed oral transit;Decreased bolus cohesion;Premature spillage Oral - Thin Straw Weak lingual  manipulation;Reduced posterior propulsion;Holding of bolus;Lingual/palatal residue;Piecemeal swallowing;Delayed oral transit;Decreased bolus cohesion;Premature spillage    03/25/2022   4:00 PM Pharyngeal Phase Pharyngeal Phase Impaired Pharyngeal- Nectar Teaspoon Delayed swallow initiation-pyriform sinuses;Reduced pharyngeal peristalsis;Reduced epiglottic inversion;Reduced airway/laryngeal closure;Penetration/Aspiration during swallow;Pharyngeal residue - valleculae;Pharyngeal residue - pyriform Pharyngeal Material enters airway, passes BELOW cords without attempt by patient to eject out (silent aspiration) Pharyngeal- Nectar Cup Delayed swallow initiation-pyriform sinuses;Reduced pharyngeal peristalsis;Reduced epiglottic inversion;Reduced airway/laryngeal closure;Penetration/Apiration after swallow;Penetration/Aspiration during swallow;Pharyngeal residue - valleculae;Pharyngeal residue - pyriform Pharyngeal Material enters airway, passes BELOW cords without attempt by patient to eject out (silent aspiration) Pharyngeal- Thin Teaspoon Delayed swallow initiation-pyriform sinuses;Reduced pharyngeal peristalsis;Reduced epiglottic inversion;Reduced airway/laryngeal closure;Penetration/Aspiration during swallow;Penetration/Apiration after swallow;Pharyngeal residue - valleculae;Pharyngeal residue - pyriform Pharyngeal Material enters airway, passes BELOW cords without attempt by patient to eject out (silent aspiration) Pharyngeal- Thin Cup Delayed swallow initiation-pyriform sinuses;Reduced pharyngeal peristalsis;Reduced epiglottic inversion;Reduced airway/laryngeal closure;Penetration/Aspiration during swallow;Penetration/Apiration after swallow;Pharyngeal residue - valleculae;Pharyngeal residue - pyriform Pharyngeal Material enters airway, passes BELOW cords without attempt by patient to eject out (silent aspiration) Pharyngeal- Thin Straw Delayed swallow initiation-pyriform sinuses;Reduced pharyngeal peristalsis;Reduced  epiglottic inversion;Reduced airway/laryngeal closure;Penetration/Aspiration during swallow;Penetration/Apiration after swallow;Pharyngeal residue - valleculae;Pharyngeal residue - pyriform Pharyngeal Material enters airway, passes BELOW cords without attempt by patient to eject out (silent aspiration)    03/25/2022   4:00 PM Cervical Esophageal Phase  Cervical Esophageal Phase [facility] Happi Overton 03/25/2022, 4:27 PM                     DG Chest Port 1 View  Result Date: 03/22/2022 CLINICAL DATA:  Concern for sepsis. Aspiration pneumonia. History of esophageal carcinoma. EXAM: PORTABLE CHEST 1 VIEW COMPARISON:  None Available. FINDINGS: Heart size appears normal. No pleural effusion identified. Pleuroparenchymal scarring identified within the lateral right lung base. Signs of bilateral lower lobe bronchiectasis. Reticular and nodular opacities are identified throughout the right lung and left lower lobe. Bilateral nodular opacities are identified within the periphery of the right upper lobe, left and left lower lobe. Within the right base there is a focal irregular opacity with surrounding architectural distortion. The visualized osseous structures appear unremarkable. IMPRESSION: 1. Bilateral nodular opacities identified within the right lung and left lower lobe. These are indeterminate. Findings may reflect areas of multifocal pneumonia. Underlying malignancy would be difficult to exclude in this patient who has a history esophageal carcinoma. Consider more definitive characterization with CT of the chest. 2. Diffuse reticulonodular opacities are noted throughout the right lung and left lower lobe which may reflect superimposed inflammation or infection. Electronically Signed   By: Kerby Moors M.D.   On: 03/22/2022 07:33      Subjective: No new complaints  Discharge Exam: Vitals:   04/02/22 0253 04/02/22 0813  BP: 105/67 105/76  Pulse: (!) 107 (!) 102  Resp: 18 18  Temp: 97.7 F (36.5 C) (!) 97.3 F  (36.3 C)  SpO2: 99% 99%   Vitals:   04/01/22 1900 04/01/22 2000 04/02/22 0253 04/02/22 0813  BP: 96/79 102/79 105/67 105/76  Pulse: (!) 106 (!) 106 (!) 107 (!) 102  Resp: '20 18 18 18  '$ Temp:  98.2 F (36.8 C) 97.7 F (36.5 C) (!) 97.3 F (36.3 C)  TempSrc:  Oral Oral   SpO2: 100% 100% 99% 99%  Weight:      Height:        General: Pt is alert, awake, not in acute distress Cardiovascular: RRR, S1/S2 +, no rubs, no gallops Respiratory: decrease bs , no wheezing Abdominal: Soft, NT, ND, bowel sounds + Extremities: no edema, no cyanosis    The results of significant diagnostics from this hospitalization (including imaging, microbiology, ancillary and laboratory) are listed below for reference.     Microbiology: Recent Results (from the past 240 hour(s))  Urine Culture     Status: None   Collection Time: 03/25/22  6:38 AM   Specimen: Urine, Clean Catch  Result Value Ref Range Status   Specimen Description   Final    URINE, CLEAN CATCH Performed at [facility], [address] [place], [place] [address]    Special Requests   Final    NONE Performed at [facility], [address] [place], [place] [address]    Culture   Final    NO GROWTH Performed at [facility], [place] [address], [place], [place] [address]    Report Status 03/26/2022 FINAL  Final  Blood culture (routine x 2)     Status: None (Preliminary result)   Collection Time: 03/31/22  2:53 PM   Specimen: BLOOD RIGHT FOREARM  Result Value  Ref Range Status   Specimen Description BLOOD RIGHT FOREARM  Final   Special Requests   Final    BOTTLES DRAWN AEROBIC AND ANAEROBIC Blood Culture results may not be optimal due to an inadequate volume of blood received in culture bottles   Culture   Final    NO GROWTH 2 DAYS Performed at [facility], [address] [place], [place] [address]    Report Status PENDING  Incomplete  Blood culture (routine x 2)     Status: None  (Preliminary result)   Collection Time: 03/31/22  3:09 PM   Specimen: BLOOD RIGHT FOREARM  Result Value Ref Range Status   Specimen Description BLOOD RIGHT FOREARM  Final   Special Requests   Final    BOTTLES DRAWN AEROBIC AND ANAEROBIC Blood Culture adequate volume   Culture   Final    NO GROWTH 2 DAYS Performed at [facility], [address] [place], [place] [address]    Report Status PENDING  Incomplete  MRSA Next Gen by PCR, Nasal     Status: None   Collection Time: 03/31/22  8:50 PM   Specimen: Nasal Mucosa; Nasal Swab  Result Value Ref Range Status   MRSA by PCR Next Gen NOT DETECTED NOT DETECTED Final    Comment: (NOTE) The GeneXpert MRSA Assay (FDA approved for NASAL specimens only), is one component of a comprehensive MRSA colonization surveillance program. It is not intended to diagnose MRSA infection nor to guide or monitor treatment for MRSA infections. Test performance is not FDA approved in patients less than 44 years old. Performed at [facility], [address] [place],  [address]      Labs: BNP (last 3 results) Recent Labs    03/22/22 0651  BNP 373.4*   Basic Metabolic Panel: Recent Labs  Lab 03/31/22 1143 04/01/22 0536  NA 133* 134*  K 3.3* 3.7  CL 103 107  CO2 18* 18*  GLUCOSE 213* 183*  BUN 19 23  CREATININE 0.74 0.81  CALCIUM 8.8* 7.9*   Liver Function Tests: No results for input(s): "AST", "ALT", "ALKPHOS", "BILITOT", "PROT", "ALBUMIN" in the last 168 hours. No results for input(s): "LIPASE", "AMYLASE" in the last 168 hours. No results for input(s): "AMMONIA" in the last 168 hours. CBC: Recent Labs  Lab 03/31/22 1143 04/01/22 0536  WBC 12.7* 15.9*  NEUTROABS 12.1*  --   HGB 12.3* 9.3*  HCT 39.4 28.7*  MCV 86.2 85.2  PLT 472* 319   Cardiac Enzymes: No results for input(s): "CKTOTAL", "CKMB", "CKMBINDEX", "TROPONINI" in the last 168 hours. BNP: Invalid input(s): "POCBNP" CBG: Recent Labs  Lab  03/26/22 1547  GLUCAP 186*   D-Dimer Recent Labs    03/31/22 1821  DDIMER 1.61*   Hgb A1c No results for input(s): "HGBA1C" in the last 72 hours. Lipid Profile No results for input(s): "CHOL", "HDL", "LDLCALC", "TRIG", "CHOLHDL", "LDLDIRECT" in the last 72 hours. Thyroid function studies Recent Labs    03/31/22 1821  TSH 1.945   Anemia work up No results for input(s): "VITAMINB12", "FOLATE", "FERRITIN", "TIBC", "IRON", "RETICCTPCT" in the last 72 hours. Urinalysis    Component Value Date/Time   COLORURINE YELLOW (A) 03/25/2022 0638   APPEARANCEUR CLEAR (A) 03/25/2022 0638   LABSPEC 1.018 03/25/2022 0638   PHURINE 7.0 03/25/2022 [place] 03/25/2022 0638   HGBUR NEGATIVE 03/25/2022 0638   BILIRUBINUR NEGATIVE 03/25/2022 0638   KETONESUR 5 (A) 03/25/2022 0638   PROTEINUR NEGATIVE 03/25/2022  [place] 03/25/2022 [place] 03/25/2022 2836   Sepsis Labs Recent Labs  Lab 03/31/22 1143 04/01/22 0536  WBC 12.7* 15.9*   Microbiology Recent Results (from the past 240 hour(s))  Urine Culture     Status: None   Collection Time: 03/25/22  6:38 AM   Specimen: Urine, Clean Catch  Result Value Ref Range Status   Specimen Description   Final    URINE, CLEAN CATCH Performed at [facility], [address], [place], [place] [address]    Special Requests   Final    NONE Performed at [facility], [address], [place], [place] [address]    Culture   Final    NO GROWTH Performed at [facility], [place] [address] [place], [place] [address]    Report Status 03/26/2022 FINAL  Final  Blood culture (routine x 2)     Status: None (Preliminary result)   Collection Time: 03/31/22  2:53 PM   Specimen: BLOOD RIGHT FOREARM  Result Value Ref Range Status   Specimen Description BLOOD RIGHT FOREARM  Final   Special Requests   Final    BOTTLES DRAWN AEROBIC AND ANAEROBIC Blood Culture results may not be  optimal due to an inadequate volume of blood received in culture bottles   Culture   Final    NO GROWTH 2 DAYS Performed at [facility], [address] [place], [place] [address]    Report Status PENDING  Incomplete  Blood culture (routine x 2)     Status: None (Preliminary result)   Collection Time: 03/31/22  3:09 PM   Specimen: BLOOD RIGHT FOREARM  Result Value Ref Range Status   Specimen Description BLOOD RIGHT FOREARM  Final   Special Requests   Final    BOTTLES DRAWN AEROBIC AND ANAEROBIC Blood Culture adequate volume   Culture   Final    NO GROWTH 2 DAYS Performed at [facility], [address] [place] [address], [place], [place] [address]    Report Status PENDING  Incomplete  MRSA Next Gen by PCR, Nasal     Status: None   Collection Time: 03/31/22  8:50 PM   Specimen: Nasal Mucosa; Nasal Swab  Result Value Ref Range Status   MRSA by PCR Next Gen NOT DETECTED NOT DETECTED Final    Comment: (NOTE) The GeneXpert MRSA Assay (FDA approved for NASAL specimens only), is one component of a comprehensive MRSA colonization surveillance program. It is not intended to diagnose MRSA infection nor to guide or monitor treatment for MRSA infections. Test performance is not FDA approved in patients less than 46 years old. Performed at [facility], [address] [place], [place] [address]      Time coordinating discharge: Over 30 minutes  SIGNED:   Nolberto Hanlon, MD  [facility] 04/02/2022, 1:58 PM Pager   If 7PM-7AM, please contact night-coverage [email or web address] Password [email or web address]

## 2022-04-02 NOTE — Progress Notes (Addendum)
Daily Progress Note   Patient Name: Richard Adams       Date: 04/02/2022 DOB: 01-May-1951  Age: 71 y.o. MRN#: [id number] Attending Physician: Nolberto Hanlon, MD Primary Care Physician: [name], PA-C Admit Date: 03/31/2022  Reason for Consultation/Follow-up: Establishing goals of care  Subjective: Notes reviewed. In to see patient for 9:00 meeting. Wife is at bedside.  We discussed his diagnoses, prognosis, GOC, EOL wishes disposition and options.  Created space and opportunity for patient  to explore thoughts and feelings regarding current medical information.   A detailed discussion was had today regarding advanced directives.  Concepts specific to code status, artifical feeding and hydration, IV antibiotics and rehospitalization were discussed.  The difference between an aggressive medical intervention path and a comfort care path was discussed.  Values and goals of care important to patient and family were attempted to be elicited.  Discussed limitations of medical interventions to prolong quality of life in some situations and discussed the concept of human mortality.  Wife discusses previous conversations with palliative at [facility], stating their conversations were the same.   Wife asks numerous questions and discussion was 2 hours total. Pictures pulled up to discuss anatomy, reviewed imaging. Discussed previous MBS. Complex questions regarding ventilation, respiration, and gas exchange was answered (bronchus and alveoli). Discussed hypothetical questions. Discussed numerous scenarios. Discussed big picture.  Discussed aspiration and how things have been going for him. She discusses his increased HR, coughing, and increased respirations with oral intake. He discusses that he does not  want any sort of thickened liquids even if it would help.    She discusses a feeding tube, he was clear he did not want this. He states he does not want to return to the hospital even if it would prolong time, because of the level of unacceptable suffering. He is clear he would not want any form of operative management if it were offered. Discussed in depth respecting his wishes, as though he wants comfort care she indicates she would want to keep him alive as long as possible. Reviewed his HPOA forms in detail with them. Of note, his HPOA states to follow the document and not the HPOA. It is not initialed to place any form of artifical nutrition or hydration.       He became frustrated with questions wife  asked. During conversation patient remained steady that he wants to go home with hospice and comfort care, and eat and drink on his terms without a feeding tube. Numerous questions answered here to the best of my ability. Discussed hospice company would have to answer the remainder of questions.   He discusses his symptom management. He discussed that the Fentanyl patch has not worked and he could not tell any difference, and that he relies on Tylenol and Oxycodone. He states Oxycodone works well for him.   I completed a MOST form today and the signed original was placed in the chart. The form was scanned and sent to medical records for it to be uploaded under ACP tab in Epic. A photocopy was also placed in the chart to be scanned into EMR. The patient outlined their wishes for the following treatment decisions:  Cardiopulmonary Resuscitation: Do Not Attempt Resuscitation (DNR/No CPR)  Medical Interventions: Comfort Measures: Keep clean, warm, and dry. Use medication by any route, positioning, wound care, and other measures to relieve pain and suffering. Use oxygen, suction and manual treatment of airway obstruction as needed for comfort. Do not transfer to the hospital unless comfort needs cannot be  met in current location.  Antibiotics: Antibiotics if indicated  IV Fluids: No IV fluids (provide other measures to ensure comfort)  Feeding Tube: No feeding tube     Length of Stay: 2  Current Medications: Scheduled Meds:   mouth rinse  15 mL Mouth Rinse 4 times per day   umeclidinium-vilanterol  2 puff Inhalation Daily    Continuous Infusions:  ampicillin-sulbactam (UNASYN) IV 3 g (04/02/22 0829)    PRN Meds: acetaminophen **OR** acetaminophen, albuterol, diphenhydrAMINE, ipratropium-albuterol, metoprolol tartrate, nicotine, ondansetron **OR** ondansetron (ZOFRAN) IV, mouth rinse, oxyCODONE, pantoprazole (PROTONIX) IV  Physical Exam Pulmonary:     Effort: Pulmonary effort is normal.  Neurological:     Mental Status: He is alert.             Vital Signs: BP 105/76 (BP Location: Left Arm)   Pulse (!) 102   Temp (!) 97.3 F (36.3 C)   Resp 18   Ht '6\' 1"'  (1.854 m)   Wt 63.4 kg   SpO2 99%   BMI 18.44 kg/m  SpO2: SpO2: 99 % O2 Device: O2 Device: Room Air O2 Flow Rate: O2 Flow Rate (L/min): 15 L/min  Intake/output summary:  Intake/Output Summary (Last 24 hours) at 04/02/2022 1203 Last data filed at 04/02/2022 0900 Gross per 24 hour  Intake 20 ml  Output 450 ml  Net -430 ml   LBM: Last BM Date : 04/01/22 Baseline Weight: Weight: 63.4 kg Most recent weight: Weight: 63.4 kg      Patient Active Problem List   Diagnosis Date Noted   Protein-calorie malnutrition, severe 04/01/2022   Acute hypoxemic respiratory failure ([place]) 03/31/2022   Leukocytosis 03/31/2022   Tracheoesophageal fistula, 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   Goals of care, counseling/discussion    Palliative care by specialist    DNR (do not resuscitate) discussion    Severe sepsis ([place]) 03/22/2022   Acute respiratory failure with hypoxia ([place]) 03/22/2022   Pressure injury of skin 03/22/2022   PAF (paroxysmal atrial fibrillation) (HCC)    Diabetes mellitus without complication (HCC)    Tobacco use  disorder    Atrial fibrillation with rapid ventricular response (HCC)    Chronic back pain    Aspiration pneumonia ([place])    Leg ulcer ([place]) 08/19/2018   Hyperlipidemia 08/19/2018  COPD (chronic obstructive pulmonary disease) ([place]) 08/19/2018   GERD (gastroesophageal reflux disease) 08/19/2018    Palliative Care Assessment & Plan   Recommendations/Plan: Home with hospice  Code Status:    Code Status Orders  (From admission, onward)           Start     Ordered   04/01/22 1440  Do not attempt resuscitation (DNR)  Continuous       Question Answer Comment  In the event of cardiac or respiratory ARREST Do not call a "code blue"   In the event of cardiac or respiratory ARREST Do not perform Intubation, CPR, defibrillation or ACLS   In the event of cardiac or respiratory ARREST Use medication by any route, position, wound care, and other measures to relive pain and suffering. May use oxygen, suction and manual treatment of airway obstruction as needed for comfort.   Comments MOST form on chart      04/01/22 1440           Code Status History     Date Active Date Inactive Code Status Order ID Comments User Context   03/31/2022 1459 04/01/2022 1440 Full Code [id number]  Criss Alvine, DO ED   03/22/2022 1130 03/26/2022 2218 Full Code [id number]  Collier Bullock, MD ED      Advance Directive Documentation    [place] Most Recent Value  Type of Advance Directive Living will  Pre-existing out of facility DNR order (yellow form or pink MOST form) --  "MOST" Form in Place? --      Care plan was discussed with team via epic chat  Thank you for allowing the Palliative Medicine Team to assist in the care of this patient.     Asencion Gowda, NP  Please contact Palliative Medicine Team phone at [phone] for questions and concerns.

## 2022-04-02 NOTE — TOC Initial Note (Signed)
Transition of Care [facility]) - Initial/Assessment Note    Patient Details  Name: Richard Adams MRN: [id number] Date of Birth: 10-10-1950  Transition [facility]) CM/SW Contact:    [name], RN Phone Number: 04/02/2022, 2:34 PM  Clinical Narrative:                  Patient to discharge home with hospice services today Met with patient and wife at bedside who confirm they want hospice services through [name].  Referral made through [name] with Manufacturing engineer.   Patient has O2 through [employer] however patient is now on 15L O2.   Per [name] new concentrator and hospital bed to be delivered to the home by 430 . [name] has arranged EMS transport for 6pm EMS packet and signed DNR/most form on the chart       Patient Goals and CMS Choice        Expected Discharge Plan and Services           Expected Discharge Date: 04/02/22                                    Prior Living Arrangements/Services                       Activities of Daily Living Home Assistive Devices/Equipment: Electric scooter ADL Screening (condition at time of admission) Patient's cognitive ability adequate to safely complete daily activities?: Yes Is the patient deaf or have difficulty hearing?: No Does the patient have difficulty seeing, even when wearing glasses/contacts?: No Does the patient have difficulty concentrating, remembering, or making decisions?: No Patient able to express need for assistance with ADLs?: Yes Does the patient have difficulty dressing or bathing?: Yes Independently performs ADLs?: No Does the patient have difficulty walking or climbing stairs?: Yes Weakness of Legs: Both Weakness of Arms/Hands: None  Permission Sought/Granted                  Emotional Assessment              Admission diagnosis:  Respiratory distress [R06.03] SOB (shortness of breath) [R06.02] Acute hypoxemic respiratory failure ([place])  [J96.01] Patient Active Problem List   Diagnosis Date Noted   Protein-calorie malnutrition, severe 04/01/2022   Acute hypoxemic respiratory failure ([place]) 03/31/2022   Leukocytosis 03/31/2022   Tracheoesophageal fistula, 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   Goals of care, counseling/discussion    Palliative care by specialist    DNR (do not resuscitate) discussion    Severe sepsis ([place]) 03/22/2022   Acute respiratory failure with hypoxia ([place]) 03/22/2022   Pressure injury of skin 03/22/2022   PAF (paroxysmal atrial fibrillation) ([place])    Diabetes mellitus without complication ([place])    Tobacco use disorder    Atrial fibrillation with rapid ventricular response ([place])    Chronic back pain    Aspiration pneumonia ([place])    Leg ulcer ([place]) 08/19/2018   Hyperlipidemia 08/19/2018   COPD (chronic obstructive pulmonary disease) ([place]) 08/19/2018   GERD (gastroesophageal reflux disease) 08/19/2018   PCP:  [name], PA-C Pharmacy:   [id number] Shari Prows, [place] [facility] [address] [facility] [place] [address] Phone: [phone] Fax: [phone]     Social Determinants of Health (SDOH) Interventions    Readmission Risk Interventions     No data  to display

## 2022-04-05 LAB — CULTURE, BLOOD (ROUTINE X 2)
Culture: NO GROWTH
Culture: NO GROWTH
Special Requests: ADEQUATE

## 2022-04-18 DEATH — deceased
